# Patient Record
Sex: Female | Born: 1980 | Race: Black or African American | Hispanic: No | Marital: Married | Smoking: Never smoker
Health system: Southern US, Community
[De-identification: ages and names within clinical notes are randomized; demographics above are authoritative.]

## PROBLEM LIST (undated history)

## (undated) DIAGNOSIS — K859 Acute pancreatitis without necrosis or infection, unspecified: Secondary | ICD-10-CM

## (undated) DIAGNOSIS — R109 Unspecified abdominal pain: Secondary | ICD-10-CM

## (undated) DIAGNOSIS — G8929 Other chronic pain: Secondary | ICD-10-CM

## (undated) DIAGNOSIS — I2699 Other pulmonary embolism without acute cor pulmonale: Secondary | ICD-10-CM

## (undated) DIAGNOSIS — K5090 Crohn's disease, unspecified, without complications: Secondary | ICD-10-CM

## (undated) DIAGNOSIS — I10 Essential (primary) hypertension: Secondary | ICD-10-CM

## (undated) DIAGNOSIS — K259 Gastric ulcer, unspecified as acute or chronic, without hemorrhage or perforation: Secondary | ICD-10-CM

## (undated) DIAGNOSIS — K633 Ulcer of intestine: Secondary | ICD-10-CM

## (undated) DIAGNOSIS — I509 Heart failure, unspecified: Secondary | ICD-10-CM

## (undated) DIAGNOSIS — E282 Polycystic ovarian syndrome: Secondary | ICD-10-CM

## (undated) DIAGNOSIS — M797 Fibromyalgia: Secondary | ICD-10-CM

## (undated) HISTORY — PX: CHOLECYSTECTOMY: SHX55

## (undated) HISTORY — PX: TONSILLECTOMY: SUR1361

## (undated) HISTORY — PX: BREAST SURGERY: SHX581

---

## 2002-09-30 ENCOUNTER — Emergency Department (HOSPITAL_COMMUNITY): Admission: EM | Admit: 2002-09-30 | Discharge: 2002-10-01 | Payer: Self-pay | Admitting: Emergency Medicine

## 2002-10-10 ENCOUNTER — Encounter: Admission: RE | Admit: 2002-10-10 | Discharge: 2002-10-10 | Payer: Self-pay | Admitting: *Deleted

## 2004-03-26 ENCOUNTER — Emergency Department (HOSPITAL_COMMUNITY): Admission: EM | Admit: 2004-03-26 | Discharge: 2004-03-26 | Payer: Self-pay | Admitting: Emergency Medicine

## 2005-06-30 ENCOUNTER — Emergency Department (HOSPITAL_COMMUNITY): Admission: EM | Admit: 2005-06-30 | Discharge: 2005-07-01 | Payer: Self-pay | Admitting: Emergency Medicine

## 2006-02-16 ENCOUNTER — Emergency Department: Payer: Self-pay | Admitting: Emergency Medicine

## 2006-03-01 ENCOUNTER — Emergency Department (HOSPITAL_COMMUNITY): Admission: EM | Admit: 2006-03-01 | Discharge: 2006-03-02 | Payer: Self-pay | Admitting: Emergency Medicine

## 2006-04-11 ENCOUNTER — Emergency Department: Payer: Self-pay | Admitting: Emergency Medicine

## 2006-05-06 ENCOUNTER — Emergency Department: Payer: Self-pay | Admitting: Unknown Physician Specialty

## 2006-06-08 ENCOUNTER — Emergency Department: Payer: Self-pay | Admitting: Emergency Medicine

## 2006-06-14 ENCOUNTER — Emergency Department: Payer: Self-pay | Admitting: Emergency Medicine

## 2006-10-14 ENCOUNTER — Emergency Department: Payer: Self-pay | Admitting: Emergency Medicine

## 2006-10-15 ENCOUNTER — Ambulatory Visit: Payer: Self-pay | Admitting: Emergency Medicine

## 2006-10-24 ENCOUNTER — Inpatient Hospital Stay (HOSPITAL_COMMUNITY): Admission: AD | Admit: 2006-10-24 | Discharge: 2006-10-24 | Payer: Self-pay | Admitting: Family Medicine

## 2006-10-30 ENCOUNTER — Inpatient Hospital Stay (HOSPITAL_COMMUNITY): Admission: AD | Admit: 2006-10-30 | Discharge: 2006-10-30 | Payer: Self-pay | Admitting: Gynecology

## 2006-12-09 ENCOUNTER — Emergency Department: Payer: Self-pay | Admitting: Emergency Medicine

## 2007-02-22 ENCOUNTER — Emergency Department: Payer: Self-pay | Admitting: General Practice

## 2007-03-30 ENCOUNTER — Emergency Department: Payer: Self-pay | Admitting: Emergency Medicine

## 2007-05-19 ENCOUNTER — Emergency Department: Payer: Self-pay | Admitting: General Practice

## 2007-05-21 ENCOUNTER — Emergency Department (HOSPITAL_COMMUNITY): Admission: EM | Admit: 2007-05-21 | Discharge: 2007-05-21 | Payer: Self-pay | Admitting: Emergency Medicine

## 2007-05-22 ENCOUNTER — Ambulatory Visit (HOSPITAL_COMMUNITY): Admission: RE | Admit: 2007-05-22 | Discharge: 2007-05-22 | Payer: Self-pay | Admitting: Emergency Medicine

## 2008-05-03 ENCOUNTER — Encounter (INDEPENDENT_AMBULATORY_CARE_PROVIDER_SITE_OTHER): Payer: Self-pay | Admitting: Emergency Medicine

## 2008-05-03 ENCOUNTER — Ambulatory Visit: Payer: Self-pay | Admitting: Vascular Surgery

## 2008-05-03 ENCOUNTER — Emergency Department (HOSPITAL_COMMUNITY): Admission: EM | Admit: 2008-05-03 | Discharge: 2008-05-03 | Payer: Self-pay | Admitting: Emergency Medicine

## 2008-08-31 ENCOUNTER — Emergency Department (HOSPITAL_BASED_OUTPATIENT_CLINIC_OR_DEPARTMENT_OTHER): Admission: EM | Admit: 2008-08-31 | Discharge: 2008-08-31 | Payer: Self-pay | Admitting: Emergency Medicine

## 2008-09-08 ENCOUNTER — Inpatient Hospital Stay: Payer: Self-pay | Admitting: Internal Medicine

## 2008-10-26 ENCOUNTER — Emergency Department (HOSPITAL_BASED_OUTPATIENT_CLINIC_OR_DEPARTMENT_OTHER): Admission: EM | Admit: 2008-10-26 | Discharge: 2008-10-26 | Payer: Self-pay | Admitting: Emergency Medicine

## 2008-10-28 ENCOUNTER — Emergency Department (HOSPITAL_BASED_OUTPATIENT_CLINIC_OR_DEPARTMENT_OTHER): Admission: EM | Admit: 2008-10-28 | Discharge: 2008-10-28 | Payer: Self-pay | Admitting: Emergency Medicine

## 2008-11-10 ENCOUNTER — Emergency Department (HOSPITAL_COMMUNITY): Admission: EM | Admit: 2008-11-10 | Discharge: 2008-11-10 | Payer: Self-pay | Admitting: Emergency Medicine

## 2008-12-03 ENCOUNTER — Emergency Department (HOSPITAL_BASED_OUTPATIENT_CLINIC_OR_DEPARTMENT_OTHER): Admission: EM | Admit: 2008-12-03 | Discharge: 2008-12-03 | Payer: Self-pay | Admitting: Emergency Medicine

## 2008-12-03 ENCOUNTER — Ambulatory Visit: Payer: Self-pay | Admitting: Radiology

## 2008-12-30 ENCOUNTER — Ambulatory Visit: Payer: Self-pay | Admitting: Diagnostic Radiology

## 2008-12-30 ENCOUNTER — Emergency Department (HOSPITAL_BASED_OUTPATIENT_CLINIC_OR_DEPARTMENT_OTHER): Admission: EM | Admit: 2008-12-30 | Discharge: 2008-12-30 | Payer: Self-pay | Admitting: Emergency Medicine

## 2009-04-02 ENCOUNTER — Emergency Department (HOSPITAL_BASED_OUTPATIENT_CLINIC_OR_DEPARTMENT_OTHER): Admission: EM | Admit: 2009-04-02 | Discharge: 2009-04-02 | Payer: Self-pay | Admitting: Emergency Medicine

## 2009-06-15 ENCOUNTER — Emergency Department (HOSPITAL_BASED_OUTPATIENT_CLINIC_OR_DEPARTMENT_OTHER): Admission: EM | Admit: 2009-06-15 | Discharge: 2009-06-16 | Payer: Self-pay | Admitting: Emergency Medicine

## 2009-07-16 ENCOUNTER — Inpatient Hospital Stay (HOSPITAL_COMMUNITY): Admission: AD | Admit: 2009-07-16 | Discharge: 2009-07-16 | Payer: Self-pay | Admitting: Obstetrics & Gynecology

## 2009-09-09 ENCOUNTER — Emergency Department (HOSPITAL_BASED_OUTPATIENT_CLINIC_OR_DEPARTMENT_OTHER): Admission: EM | Admit: 2009-09-09 | Discharge: 2009-09-09 | Payer: Self-pay | Admitting: Emergency Medicine

## 2009-09-09 ENCOUNTER — Ambulatory Visit: Payer: Self-pay | Admitting: Diagnostic Radiology

## 2009-11-01 ENCOUNTER — Emergency Department (HOSPITAL_BASED_OUTPATIENT_CLINIC_OR_DEPARTMENT_OTHER): Admission: EM | Admit: 2009-11-01 | Discharge: 2009-11-01 | Payer: Self-pay | Admitting: Emergency Medicine

## 2009-11-22 ENCOUNTER — Emergency Department (HOSPITAL_COMMUNITY): Admission: EM | Admit: 2009-11-22 | Discharge: 2009-11-22 | Payer: Self-pay | Admitting: Emergency Medicine

## 2010-01-10 ENCOUNTER — Emergency Department (HOSPITAL_BASED_OUTPATIENT_CLINIC_OR_DEPARTMENT_OTHER): Admission: EM | Admit: 2010-01-10 | Discharge: 2010-01-10 | Payer: Self-pay | Admitting: Emergency Medicine

## 2010-01-10 ENCOUNTER — Ambulatory Visit: Payer: Self-pay | Admitting: Interventional Radiology

## 2010-03-11 ENCOUNTER — Emergency Department (HOSPITAL_BASED_OUTPATIENT_CLINIC_OR_DEPARTMENT_OTHER): Admission: EM | Admit: 2010-03-11 | Discharge: 2010-03-11 | Payer: Self-pay | Admitting: Emergency Medicine

## 2010-04-20 ENCOUNTER — Emergency Department: Payer: Self-pay | Admitting: Emergency Medicine

## 2010-05-22 ENCOUNTER — Emergency Department (HOSPITAL_BASED_OUTPATIENT_CLINIC_OR_DEPARTMENT_OTHER): Admission: EM | Admit: 2010-05-22 | Discharge: 2010-05-22 | Payer: Self-pay | Admitting: Emergency Medicine

## 2010-06-27 ENCOUNTER — Emergency Department (HOSPITAL_BASED_OUTPATIENT_CLINIC_OR_DEPARTMENT_OTHER): Admission: EM | Admit: 2010-06-27 | Discharge: 2010-06-27 | Payer: Self-pay | Admitting: Emergency Medicine

## 2010-06-29 ENCOUNTER — Emergency Department (HOSPITAL_BASED_OUTPATIENT_CLINIC_OR_DEPARTMENT_OTHER): Admission: EM | Admit: 2010-06-29 | Discharge: 2010-06-29 | Payer: Self-pay | Admitting: Emergency Medicine

## 2010-07-13 ENCOUNTER — Emergency Department: Payer: Self-pay | Admitting: Emergency Medicine

## 2010-07-31 ENCOUNTER — Emergency Department (HOSPITAL_BASED_OUTPATIENT_CLINIC_OR_DEPARTMENT_OTHER): Admission: EM | Admit: 2010-07-31 | Discharge: 2010-07-31 | Payer: Self-pay | Admitting: Emergency Medicine

## 2010-08-10 ENCOUNTER — Ambulatory Visit: Payer: Self-pay | Admitting: Diagnostic Radiology

## 2010-08-10 ENCOUNTER — Emergency Department (HOSPITAL_BASED_OUTPATIENT_CLINIC_OR_DEPARTMENT_OTHER): Admission: EM | Admit: 2010-08-10 | Discharge: 2010-08-10 | Payer: Self-pay | Admitting: Emergency Medicine

## 2010-09-02 ENCOUNTER — Inpatient Hospital Stay (HOSPITAL_COMMUNITY): Admission: AD | Admit: 2010-09-02 | Discharge: 2010-09-05 | Payer: Self-pay | Admitting: Psychiatry

## 2010-09-02 ENCOUNTER — Ambulatory Visit: Payer: Self-pay | Admitting: Psychiatry

## 2010-09-09 ENCOUNTER — Emergency Department (HOSPITAL_BASED_OUTPATIENT_CLINIC_OR_DEPARTMENT_OTHER): Admission: EM | Admit: 2010-09-09 | Discharge: 2010-09-09 | Payer: Self-pay | Admitting: Emergency Medicine

## 2010-09-09 ENCOUNTER — Ambulatory Visit: Payer: Self-pay | Admitting: Diagnostic Radiology

## 2010-10-03 ENCOUNTER — Emergency Department (HOSPITAL_BASED_OUTPATIENT_CLINIC_OR_DEPARTMENT_OTHER): Admission: EM | Admit: 2010-10-03 | Discharge: 2010-10-03 | Payer: Self-pay | Admitting: Emergency Medicine

## 2010-11-17 ENCOUNTER — Ambulatory Visit: Payer: Self-pay | Admitting: Diagnostic Radiology

## 2010-11-17 ENCOUNTER — Emergency Department (HOSPITAL_BASED_OUTPATIENT_CLINIC_OR_DEPARTMENT_OTHER): Admission: EM | Admit: 2010-11-17 | Discharge: 2010-11-17 | Payer: Self-pay | Admitting: Emergency Medicine

## 2010-12-23 ENCOUNTER — Emergency Department (HOSPITAL_BASED_OUTPATIENT_CLINIC_OR_DEPARTMENT_OTHER)
Admission: EM | Admit: 2010-12-23 | Discharge: 2010-12-23 | Payer: Self-pay | Source: Home / Self Care | Admitting: Emergency Medicine

## 2011-02-16 ENCOUNTER — Emergency Department (HOSPITAL_BASED_OUTPATIENT_CLINIC_OR_DEPARTMENT_OTHER)
Admission: EM | Admit: 2011-02-16 | Discharge: 2011-02-16 | Disposition: A | Discharge status: Home / Self Care | Payer: Medicare Other | Attending: Emergency Medicine | Admitting: Emergency Medicine

## 2011-02-16 DIAGNOSIS — Z86718 Personal history of other venous thrombosis and embolism: Secondary | ICD-10-CM | POA: Insufficient documentation

## 2011-02-16 DIAGNOSIS — I10 Essential (primary) hypertension: Secondary | ICD-10-CM | POA: Insufficient documentation

## 2011-02-16 DIAGNOSIS — E119 Type 2 diabetes mellitus without complications: Secondary | ICD-10-CM | POA: Insufficient documentation

## 2011-02-16 DIAGNOSIS — M549 Dorsalgia, unspecified: Secondary | ICD-10-CM | POA: Insufficient documentation

## 2011-02-16 DIAGNOSIS — G43909 Migraine, unspecified, not intractable, without status migrainosus: Secondary | ICD-10-CM | POA: Insufficient documentation

## 2011-02-16 DIAGNOSIS — Z79899 Other long term (current) drug therapy: Secondary | ICD-10-CM | POA: Insufficient documentation

## 2011-02-28 ENCOUNTER — Emergency Department (INDEPENDENT_AMBULATORY_CARE_PROVIDER_SITE_OTHER): Payer: Medicare Other

## 2011-02-28 ENCOUNTER — Emergency Department (HOSPITAL_BASED_OUTPATIENT_CLINIC_OR_DEPARTMENT_OTHER)
Admission: EM | Admit: 2011-02-28 | Discharge: 2011-02-28 | Disposition: A | Discharge status: Home / Self Care | Payer: Medicare Other | Attending: Emergency Medicine | Admitting: Emergency Medicine

## 2011-02-28 DIAGNOSIS — G43909 Migraine, unspecified, not intractable, without status migrainosus: Secondary | ICD-10-CM | POA: Insufficient documentation

## 2011-02-28 DIAGNOSIS — I10 Essential (primary) hypertension: Secondary | ICD-10-CM | POA: Insufficient documentation

## 2011-02-28 DIAGNOSIS — J029 Acute pharyngitis, unspecified: Secondary | ICD-10-CM | POA: Insufficient documentation

## 2011-02-28 DIAGNOSIS — J069 Acute upper respiratory infection, unspecified: Secondary | ICD-10-CM | POA: Insufficient documentation

## 2011-02-28 DIAGNOSIS — E119 Type 2 diabetes mellitus without complications: Secondary | ICD-10-CM | POA: Insufficient documentation

## 2011-02-28 DIAGNOSIS — Z86718 Personal history of other venous thrombosis and embolism: Secondary | ICD-10-CM | POA: Insufficient documentation

## 2011-02-28 DIAGNOSIS — Z79899 Other long term (current) drug therapy: Secondary | ICD-10-CM | POA: Insufficient documentation

## 2011-02-28 LAB — URINALYSIS, ROUTINE W REFLEX MICROSCOPIC
Bilirubin Urine: NEGATIVE
Glucose, UA: NEGATIVE mg/dL
Hgb urine dipstick: NEGATIVE
Protein, ur: NEGATIVE mg/dL
Urobilinogen, UA: 1 mg/dL (ref 0.0–1.0)

## 2011-02-28 LAB — RAPID STREP SCREEN (MED CTR MEBANE ONLY): Streptococcus, Group A Screen (Direct): NEGATIVE

## 2011-03-02 LAB — POCT CARDIAC MARKERS
CKMB, poc: <1 ng/mL — ABNORMAL LOW (ref 1.0–8.0)
Myoglobin, poc: 29.3 ng/mL (ref 12–200)
Myoglobin, poc: 41 ng/mL (ref 12–200)
Troponin i, poc: <0.05 ng/mL (ref 0.00–0.09)

## 2011-03-02 LAB — COMPREHENSIVE METABOLIC PANEL
ALT: 15 U/L (ref 0–35)
AST: 18 U/L (ref 0–37)
Alkaline Phosphatase: 90 U/L (ref 39–117)
CO2: 28 mEq/L (ref 19–32)
Calcium: 8.9 mg/dL (ref 8.4–10.5)
Chloride: 106 mEq/L (ref 96–112)
GFR calc Af Amer: >60 mL/min (ref >60)
GFR calc non Af Amer: >60 mL/min (ref >60)
Glucose, Bld: 92 mg/dL (ref 70–99)
Sodium: 145 mEq/L (ref 135–145)
Total Bilirubin: 0.4 mg/dL (ref 0.3–1.2)

## 2011-03-02 LAB — CBC
HCT: 33.1 % — ABNORMAL LOW (ref 36.0–46.0)
Hemoglobin: 11 g/dL — ABNORMAL LOW (ref 12.0–15.0)
MCHC: 33.2 g/dL (ref 30.0–36.0)
RBC: 4.43 MIL/uL (ref 3.87–5.11)

## 2011-03-02 LAB — URINE CULTURE
Colony Count: 9000
Culture  Setup Time: 201201040118

## 2011-03-02 LAB — DIFFERENTIAL
Basophils Absolute: 0 10*3/uL (ref 0.0–0.1)
Basophils Relative: 0 % (ref 0–1)
Eosinophils Absolute: 0.2 10*3/uL (ref 0.0–0.7)
Eosinophils Relative: 3 % (ref 0–5)
Neutrophils Relative %: 55 % (ref 43–77)

## 2011-03-02 LAB — URINALYSIS, ROUTINE W REFLEX MICROSCOPIC
Bilirubin Urine: NEGATIVE
Glucose, UA: NEGATIVE mg/dL
Ketones, ur: NEGATIVE mg/dL
Specific Gravity, Urine: 1.009 (ref 1.005–1.030)
pH: 6.5 (ref 5.0–8.0)

## 2011-03-03 LAB — URINALYSIS, ROUTINE W REFLEX MICROSCOPIC
Bilirubin Urine: NEGATIVE
Glucose, UA: NEGATIVE mg/dL
Ketones, ur: NEGATIVE mg/dL
Nitrite: NEGATIVE
Specific Gravity, Urine: 1.005 (ref 1.005–1.030)
pH: 6 (ref 5.0–8.0)

## 2011-03-03 LAB — WET PREP, GENITAL
Trich, Wet Prep: NONE SEEN
WBC, Wet Prep HPF POC: NONE SEEN
Yeast Wet Prep HPF POC: NONE SEEN

## 2011-03-03 LAB — GC/CHLAMYDIA PROBE AMP, GENITAL
Chlamydia, DNA Probe: NEGATIVE
GC Probe Amp, Genital: NEGATIVE

## 2011-03-03 LAB — PREGNANCY, URINE: Preg Test, Ur: NEGATIVE

## 2011-03-05 LAB — URINALYSIS, ROUTINE W REFLEX MICROSCOPIC
Bilirubin Urine: NEGATIVE
Glucose, UA: NEGATIVE mg/dL
Hgb urine dipstick: NEGATIVE
Hgb urine dipstick: NEGATIVE
Ketones, ur: NEGATIVE mg/dL
Nitrite: NEGATIVE
Specific Gravity, Urine: 1.004 — ABNORMAL LOW (ref 1.005–1.030)
Urobilinogen, UA: 0.2 mg/dL (ref 0.0–1.0)
pH: 6 (ref 5.0–8.0)
pH: 6.5 (ref 5.0–8.0)

## 2011-03-05 LAB — GLUCOSE, CAPILLARY
Glucose-Capillary: 100 mg/dL — ABNORMAL HIGH (ref 70–99)
Glucose-Capillary: 87 mg/dL (ref 70–99)
Glucose-Capillary: 97 mg/dL (ref 70–99)

## 2011-03-05 LAB — BASIC METABOLIC PANEL
CO2: 27 mEq/L (ref 19–32)
Chloride: 104 mEq/L (ref 96–112)
GFR calc Af Amer: >60 mL/min (ref >60)
Glucose, Bld: 98 mg/dL (ref 70–99)
Sodium: 142 mEq/L (ref 135–145)

## 2011-03-05 LAB — PREGNANCY, URINE: Preg Test, Ur: NEGATIVE

## 2011-03-06 LAB — CBC
MCH: 26.5 pg (ref 26.0–34.0)
MCV: 80.1 fL (ref 78.0–100.0)
Platelets: 271 10*3/uL (ref 150–400)
Platelets: 251 10*3/uL (ref 150–400)
RBC: 4.4 MIL/uL (ref 3.87–5.11)
RBC: 4.27 MIL/uL (ref 3.87–5.11)
RDW: 16.6 % — ABNORMAL HIGH (ref 11.5–15.5)
WBC: 9.5 10*3/uL (ref 4.0–10.5)
WBC: 8.8 10*3/uL (ref 4.0–10.5)

## 2011-03-06 LAB — URINALYSIS, ROUTINE W REFLEX MICROSCOPIC
Bilirubin Urine: NEGATIVE
Glucose, UA: NEGATIVE mg/dL
Glucose, UA: NEGATIVE mg/dL
Hgb urine dipstick: NEGATIVE
Ketones, ur: NEGATIVE mg/dL
Protein, ur: NEGATIVE mg/dL
Specific Gravity, Urine: 1.012 (ref 1.005–1.030)
pH: 7 (ref 5.0–8.0)
pH: 6 (ref 5.0–8.0)

## 2011-03-06 LAB — BASIC METABOLIC PANEL
CO2: 28 mEq/L (ref 19–32)
CO2: 25 mEq/L (ref 19–32)
Calcium: 9.2 mg/dL (ref 8.4–10.5)
Calcium: 9.2 mg/dL (ref 8.4–10.5)
Chloride: 103 mEq/L (ref 96–112)
Creatinine, Ser: 0.8 mg/dL (ref 0.4–1.2)
Creatinine, Ser: 0.7 mg/dL (ref 0.4–1.2)
GFR calc Af Amer: >60 mL/min (ref >60)
GFR calc Af Amer: >60 mL/min (ref >60)
GFR calc non Af Amer: >60 mL/min (ref >60)
Sodium: 141 mEq/L (ref 135–145)

## 2011-03-06 LAB — DIFFERENTIAL
Basophils Absolute: 0.1 10*3/uL (ref 0.0–0.1)
Basophils Relative: 1 % (ref 0–1)
Eosinophils Absolute: 0.1 10*3/uL (ref 0.0–0.7)
Eosinophils Relative: 2 % (ref 0–5)
Lymphocytes Relative: 31 % (ref 12–46)
Lymphs Abs: 3 10*3/uL (ref 0.7–4.0)
Lymphs Abs: 2.3 10*3/uL (ref 0.7–4.0)
Neutro Abs: 5.8 10*3/uL (ref 1.7–7.7)
Neutrophils Relative %: 57 % (ref 43–77)
Neutrophils Relative %: 66 % (ref 43–77)

## 2011-03-06 LAB — POCT CARDIAC MARKERS
CKMB, poc: <1 ng/mL — ABNORMAL LOW (ref 1.0–8.0)
Myoglobin, poc: 64.1 ng/mL (ref 12–200)

## 2011-03-06 LAB — POCT TOXICOLOGY PANEL

## 2011-03-06 LAB — PROTIME-INR
INR: 1.02 (ref 0.00–1.49)
Prothrombin Time: 13.6 seconds (ref 11.6–15.2)

## 2011-03-06 LAB — PREGNANCY, URINE: Preg Test, Ur: NEGATIVE

## 2011-03-06 LAB — URINE MICROSCOPIC-ADD ON

## 2011-03-06 LAB — APTT: aPTT: 32 seconds (ref 24–37)

## 2011-03-08 LAB — URINALYSIS, ROUTINE W REFLEX MICROSCOPIC
Bilirubin Urine: NEGATIVE
Glucose, UA: NEGATIVE mg/dL
Hgb urine dipstick: NEGATIVE
Specific Gravity, Urine: 1.024 (ref 1.005–1.030)
Urobilinogen, UA: 1 mg/dL (ref 0.0–1.0)

## 2011-03-08 LAB — DIFFERENTIAL
Basophils Absolute: 0.1 10*3/uL (ref 0.0–0.1)
Basophils Relative: 1 % (ref 0–1)
Eosinophils Relative: 1 % (ref 0–5)
Lymphocytes Relative: 28 % (ref 12–46)
Lymphs Abs: 2.5 10*3/uL (ref 0.7–4.0)
Monocytes Absolute: 0.9 10*3/uL (ref 0.1–1.0)
Monocytes Relative: 8 % (ref 3–12)
Neutro Abs: 5.5 10*3/uL (ref 1.7–7.7)
Neutrophils Relative %: 60 % (ref 43–77)

## 2011-03-08 LAB — BASIC METABOLIC PANEL
CO2: 28 mEq/L (ref 19–32)
CO2: 28 mEq/L (ref 19–32)
Calcium: 9.9 mg/dL (ref 8.4–10.5)
GFR calc Af Amer: >60 mL/min (ref >60)
GFR calc non Af Amer: >60 mL/min (ref >60)
Glucose, Bld: 87 mg/dL (ref 70–99)
Potassium: 3.6 mEq/L (ref 3.5–5.1)
Sodium: 141 mEq/L (ref 135–145)
Sodium: 143 mEq/L (ref 135–145)

## 2011-03-08 LAB — URINE MICROSCOPIC-ADD ON

## 2011-03-08 LAB — CBC
HCT: 37.7 % (ref 36.0–46.0)
Hemoglobin: 11.6 g/dL — ABNORMAL LOW (ref 12.0–15.0)
Hemoglobin: 12.3 g/dL (ref 12.0–15.0)
MCHC: 32.5 g/dL (ref 30.0–36.0)
RBC: 4.62 MIL/uL (ref 3.87–5.11)
RDW: 15 % (ref 11.5–15.5)

## 2011-03-08 LAB — LIPASE, BLOOD: Lipase: 124 U/L (ref 23–300)

## 2011-03-09 LAB — URINALYSIS, ROUTINE W REFLEX MICROSCOPIC
Bilirubin Urine: NEGATIVE
Hgb urine dipstick: NEGATIVE
Ketones, ur: NEGATIVE mg/dL
Protein, ur: NEGATIVE mg/dL
Urobilinogen, UA: 0.2 mg/dL (ref 0.0–1.0)

## 2011-03-09 LAB — WOUND CULTURE

## 2011-03-15 LAB — URINALYSIS, ROUTINE W REFLEX MICROSCOPIC
Bilirubin Urine: NEGATIVE
Glucose, UA: NEGATIVE mg/dL
Hgb urine dipstick: NEGATIVE
Ketones, ur: NEGATIVE mg/dL
Protein, ur: NEGATIVE mg/dL
pH: 6 (ref 5.0–8.0)

## 2011-03-15 LAB — COMPREHENSIVE METABOLIC PANEL
ALT: 13 U/L (ref 0–35)
Albumin: 4.3 g/dL (ref 3.5–5.2)
Alkaline Phosphatase: 79 U/L (ref 39–117)
BUN: 8 mg/dL (ref 6–23)
Chloride: 103 mEq/L (ref 96–112)
Glucose, Bld: 108 mg/dL — ABNORMAL HIGH (ref 70–99)
Potassium: 3.7 mEq/L (ref 3.5–5.1)
Sodium: 141 mEq/L (ref 135–145)
Total Bilirubin: 0.3 mg/dL (ref 0.3–1.2)

## 2011-03-15 LAB — CBC
HCT: 36.3 % (ref 36.0–46.0)
Hemoglobin: 11.8 g/dL — ABNORMAL LOW (ref 12.0–15.0)
Platelets: 281 10*3/uL (ref 150–400)
WBC: 9.6 10*3/uL (ref 4.0–10.5)

## 2011-03-15 LAB — DIFFERENTIAL
Basophils Absolute: 0.1 10*3/uL (ref 0.0–0.1)
Basophils Relative: 1 % (ref 0–1)
Eosinophils Absolute: 0.1 10*3/uL (ref 0.0–0.7)
Monocytes Absolute: 0.7 10*3/uL (ref 0.1–1.0)
Monocytes Relative: 7 % (ref 3–12)
Neutro Abs: 5.8 10*3/uL (ref 1.7–7.7)

## 2011-03-24 LAB — WET PREP, GENITAL
Trich, Wet Prep: NONE SEEN
Yeast Wet Prep HPF POC: NONE SEEN

## 2011-03-24 LAB — URINALYSIS, ROUTINE W REFLEX MICROSCOPIC
Bilirubin Urine: NEGATIVE
Glucose, UA: NEGATIVE mg/dL
Hgb urine dipstick: NEGATIVE
Ketones, ur: NEGATIVE mg/dL
Specific Gravity, Urine: 1.018 (ref 1.005–1.030)
pH: 8.5 — ABNORMAL HIGH (ref 5.0–8.0)

## 2011-03-24 LAB — GC/CHLAMYDIA PROBE AMP, GENITAL
Chlamydia, DNA Probe: NEGATIVE
GC Probe Amp, Genital: NEGATIVE

## 2011-03-25 LAB — URINALYSIS, ROUTINE W REFLEX MICROSCOPIC
Bilirubin Urine: NEGATIVE
Glucose, UA: NEGATIVE mg/dL
Hgb urine dipstick: NEGATIVE
Protein, ur: NEGATIVE mg/dL
Urobilinogen, UA: 1 mg/dL (ref 0.0–1.0)

## 2011-03-27 LAB — BASIC METABOLIC PANEL
CO2: 30 mEq/L (ref 19–32)
Calcium: 9.7 mg/dL (ref 8.4–10.5)
Chloride: 102 mEq/L (ref 96–112)
Creatinine, Ser: 0.8 mg/dL (ref 0.4–1.2)
GFR calc Af Amer: >60 mL/min (ref >60)
Glucose, Bld: 79 mg/dL (ref 70–99)

## 2011-03-29 LAB — URINALYSIS, ROUTINE W REFLEX MICROSCOPIC
Glucose, UA: NEGATIVE mg/dL
Hgb urine dipstick: NEGATIVE
Leukocytes, UA: NEGATIVE
pH: 6.5 (ref 5.0–8.0)

## 2011-03-29 LAB — WET PREP, GENITAL
Trich, Wet Prep: NONE SEEN
Yeast Wet Prep HPF POC: NONE SEEN

## 2011-03-29 LAB — URINE MICROSCOPIC-ADD ON

## 2011-03-30 LAB — URINE MICROSCOPIC-ADD ON

## 2011-03-30 LAB — COMPREHENSIVE METABOLIC PANEL
ALT: <6 U/L (ref 0–35)
Albumin: 4.4 g/dL (ref 3.5–5.2)
Alkaline Phosphatase: 85 U/L (ref 39–117)
BUN: 6 mg/dL (ref 6–23)
Chloride: 104 mEq/L (ref 96–112)
Glucose, Bld: 104 mg/dL — ABNORMAL HIGH (ref 70–99)
Potassium: 3.8 mEq/L (ref 3.5–5.1)
Total Bilirubin: 0.4 mg/dL (ref 0.3–1.2)

## 2011-03-30 LAB — DIFFERENTIAL
Basophils Absolute: 0.1 10*3/uL (ref 0.0–0.1)
Basophils Relative: 1 % (ref 0–1)
Eosinophils Absolute: 0.3 10*3/uL (ref 0.0–0.7)
Monocytes Absolute: 1 10*3/uL (ref 0.1–1.0)
Neutro Abs: 5.2 10*3/uL (ref 1.7–7.7)

## 2011-03-30 LAB — URINALYSIS, ROUTINE W REFLEX MICROSCOPIC
Bilirubin Urine: NEGATIVE
Glucose, UA: NEGATIVE mg/dL
Ketones, ur: 15 mg/dL — AB
Specific Gravity, Urine: 1.028 (ref 1.005–1.030)
pH: 6.5 (ref 5.0–8.0)

## 2011-03-30 LAB — CBC
MCV: 81.7 fL (ref 78.0–100.0)
Platelets: 269 10*3/uL (ref 150–400)
WBC: 10.2 10*3/uL (ref 4.0–10.5)

## 2011-03-30 LAB — ABO/RH: ABO/RH(D): O POS

## 2011-03-30 LAB — GC/CHLAMYDIA PROBE AMP, GENITAL: GC Probe Amp, Genital: NEGATIVE

## 2011-04-01 LAB — DIFFERENTIAL
Eosinophils Absolute: 0.2 10*3/uL (ref 0.0–0.7)
Lymphocytes Relative: 26 % (ref 12–46)
Lymphs Abs: 2.5 10*3/uL (ref 0.7–4.0)
Neutrophils Relative %: 66 % (ref 43–77)

## 2011-04-01 LAB — COMPREHENSIVE METABOLIC PANEL
ALT: 11 U/L (ref 0–35)
BUN: 8 mg/dL (ref 6–23)
CO2: 30 mEq/L (ref 19–32)
Calcium: 9.6 mg/dL (ref 8.4–10.5)
Creatinine, Ser: 0.7 mg/dL (ref 0.4–1.2)
GFR calc non Af Amer: >60 mL/min (ref >60)
Glucose, Bld: 91 mg/dL (ref 70–99)

## 2011-04-01 LAB — WET PREP, GENITAL: Clue Cells Wet Prep HPF POC: NONE SEEN

## 2011-04-01 LAB — URINALYSIS, ROUTINE W REFLEX MICROSCOPIC
Bilirubin Urine: NEGATIVE
Ketones, ur: NEGATIVE mg/dL
Nitrite: NEGATIVE
Specific Gravity, Urine: 1.02 (ref 1.005–1.030)
Urobilinogen, UA: 1 mg/dL (ref 0.0–1.0)
pH: 8 (ref 5.0–8.0)

## 2011-04-01 LAB — CBC
HCT: 37.4 % (ref 36.0–46.0)
Hemoglobin: 12.5 g/dL (ref 12.0–15.0)
MCHC: 33.5 g/dL (ref 30.0–36.0)
MCV: 82.3 fL (ref 78.0–100.0)
RBC: 4.54 MIL/uL (ref 3.87–5.11)

## 2011-04-01 LAB — GC/CHLAMYDIA PROBE AMP, GENITAL
Chlamydia, DNA Probe: NEGATIVE
GC Probe Amp, Genital: NEGATIVE

## 2011-04-06 LAB — URINALYSIS, ROUTINE W REFLEX MICROSCOPIC
Bilirubin Urine: NEGATIVE
Ketones, ur: 15 mg/dL — AB
Nitrite: NEGATIVE
Urobilinogen, UA: 1 mg/dL (ref 0.0–1.0)

## 2011-04-06 LAB — PREGNANCY, URINE: Preg Test, Ur: NEGATIVE

## 2011-04-06 LAB — WET PREP, GENITAL: Trich, Wet Prep: NONE SEEN

## 2011-04-06 LAB — CBC
HCT: 37.6 % (ref 36.0–46.0)
MCV: 82.2 fL (ref 78.0–100.0)
Platelets: 241 10*3/uL (ref 150–400)
RDW: 15.4 % (ref 11.5–15.5)

## 2011-04-06 LAB — DIFFERENTIAL
Basophils Absolute: 0 10*3/uL (ref 0.0–0.1)
Basophils Relative: 1 % (ref 0–1)
Eosinophils Absolute: 0.1 10*3/uL (ref 0.0–0.7)
Eosinophils Relative: 1 % (ref 0–5)

## 2011-04-06 LAB — BASIC METABOLIC PANEL
BUN: 6 mg/dL (ref 6–23)
Chloride: 101 mEq/L (ref 96–112)
GFR calc non Af Amer: >60 mL/min (ref >60)
Glucose, Bld: 98 mg/dL (ref 70–99)
Potassium: 4.1 mEq/L (ref 3.5–5.1)

## 2011-04-06 LAB — RPR: RPR Ser Ql: NONREACTIVE

## 2011-04-06 LAB — GC/CHLAMYDIA PROBE AMP, GENITAL: Chlamydia, DNA Probe: NEGATIVE

## 2011-04-23 ENCOUNTER — Emergency Department (HOSPITAL_BASED_OUTPATIENT_CLINIC_OR_DEPARTMENT_OTHER)
Admission: EM | Admit: 2011-04-23 | Discharge: 2011-04-23 | Disposition: A | Discharge status: Home / Self Care | Payer: Medicare Other | Attending: Emergency Medicine | Admitting: Emergency Medicine

## 2011-04-23 DIAGNOSIS — Z79899 Other long term (current) drug therapy: Secondary | ICD-10-CM | POA: Insufficient documentation

## 2011-04-23 DIAGNOSIS — IMO0001 Reserved for inherently not codable concepts without codable children: Secondary | ICD-10-CM | POA: Insufficient documentation

## 2011-04-23 DIAGNOSIS — E119 Type 2 diabetes mellitus without complications: Secondary | ICD-10-CM | POA: Insufficient documentation

## 2011-04-23 DIAGNOSIS — R51 Headache: Secondary | ICD-10-CM | POA: Insufficient documentation

## 2011-04-23 DIAGNOSIS — Z86718 Personal history of other venous thrombosis and embolism: Secondary | ICD-10-CM | POA: Insufficient documentation

## 2011-04-23 DIAGNOSIS — I10 Essential (primary) hypertension: Secondary | ICD-10-CM | POA: Insufficient documentation

## 2011-05-28 ENCOUNTER — Emergency Department (HOSPITAL_BASED_OUTPATIENT_CLINIC_OR_DEPARTMENT_OTHER)
Admission: EM | Admit: 2011-05-28 | Discharge: 2011-05-28 | Disposition: A | Discharge status: Home / Self Care | Payer: Medicare Other | Attending: Emergency Medicine | Admitting: Emergency Medicine

## 2011-05-28 DIAGNOSIS — R51 Headache: Secondary | ICD-10-CM | POA: Insufficient documentation

## 2011-05-28 DIAGNOSIS — E119 Type 2 diabetes mellitus without complications: Secondary | ICD-10-CM | POA: Insufficient documentation

## 2011-05-28 DIAGNOSIS — Z86718 Personal history of other venous thrombosis and embolism: Secondary | ICD-10-CM | POA: Insufficient documentation

## 2011-05-28 DIAGNOSIS — IMO0001 Reserved for inherently not codable concepts without codable children: Secondary | ICD-10-CM | POA: Insufficient documentation

## 2011-05-28 DIAGNOSIS — R109 Unspecified abdominal pain: Secondary | ICD-10-CM | POA: Insufficient documentation

## 2011-05-28 LAB — CBC
HCT: 31.9 % — ABNORMAL LOW (ref 36.0–46.0)
MCH: 25.1 pg — ABNORMAL LOW (ref 26.0–34.0)
MCHC: 33.2 g/dL (ref 30.0–36.0)
RDW: 17.1 % — ABNORMAL HIGH (ref 11.5–15.5)

## 2011-05-28 LAB — URINALYSIS, ROUTINE W REFLEX MICROSCOPIC
Glucose, UA: NEGATIVE mg/dL
Ketones, ur: NEGATIVE mg/dL
Protein, ur: NEGATIVE mg/dL

## 2011-05-28 LAB — COMPREHENSIVE METABOLIC PANEL
Albumin: 4 g/dL (ref 3.5–5.2)
BUN: 10 mg/dL (ref 6–23)
Chloride: 100 mEq/L (ref 96–112)
Creatinine, Ser: 0.7 mg/dL (ref 0.4–1.2)
GFR calc non Af Amer: >60 mL/min (ref >60)
Glucose, Bld: 125 mg/dL — ABNORMAL HIGH (ref 70–99)
Total Bilirubin: 0.1 mg/dL — ABNORMAL LOW (ref 0.3–1.2)

## 2011-05-28 LAB — LIPASE, BLOOD: Lipase: 33 U/L (ref 11–59)

## 2011-05-28 LAB — DIFFERENTIAL
Basophils Absolute: 0 10*3/uL (ref 0.0–0.1)
Basophils Relative: 0 % (ref 0–1)
Eosinophils Relative: 2 % (ref 0–5)
Lymphocytes Relative: 31 % (ref 12–46)
Monocytes Absolute: 0.5 10*3/uL (ref 0.1–1.0)
Monocytes Relative: 6 % (ref 3–12)

## 2011-06-09 ENCOUNTER — Emergency Department (HOSPITAL_BASED_OUTPATIENT_CLINIC_OR_DEPARTMENT_OTHER)
Admission: EM | Admit: 2011-06-09 | Discharge: 2011-06-09 | Disposition: A | Discharge status: Home / Self Care | Payer: Medicare Other | Attending: Emergency Medicine | Admitting: Emergency Medicine

## 2011-06-09 DIAGNOSIS — Z79899 Other long term (current) drug therapy: Secondary | ICD-10-CM | POA: Insufficient documentation

## 2011-06-09 DIAGNOSIS — I10 Essential (primary) hypertension: Secondary | ICD-10-CM | POA: Insufficient documentation

## 2011-06-09 DIAGNOSIS — Z86718 Personal history of other venous thrombosis and embolism: Secondary | ICD-10-CM | POA: Insufficient documentation

## 2011-06-09 DIAGNOSIS — E119 Type 2 diabetes mellitus without complications: Secondary | ICD-10-CM | POA: Insufficient documentation

## 2011-06-09 DIAGNOSIS — G8929 Other chronic pain: Secondary | ICD-10-CM | POA: Insufficient documentation

## 2011-06-09 DIAGNOSIS — IMO0001 Reserved for inherently not codable concepts without codable children: Secondary | ICD-10-CM | POA: Insufficient documentation

## 2011-06-14 ENCOUNTER — Emergency Department (HOSPITAL_BASED_OUTPATIENT_CLINIC_OR_DEPARTMENT_OTHER)
Admission: EM | Admit: 2011-06-14 | Discharge: 2011-06-14 | Disposition: A | Discharge status: Home / Self Care | Payer: Medicare Other | Attending: Emergency Medicine | Admitting: Emergency Medicine

## 2011-06-14 ENCOUNTER — Emergency Department (INDEPENDENT_AMBULATORY_CARE_PROVIDER_SITE_OTHER): Payer: Medicare Other

## 2011-06-14 DIAGNOSIS — I10 Essential (primary) hypertension: Secondary | ICD-10-CM | POA: Insufficient documentation

## 2011-06-14 DIAGNOSIS — R109 Unspecified abdominal pain: Secondary | ICD-10-CM | POA: Insufficient documentation

## 2011-06-14 DIAGNOSIS — E119 Type 2 diabetes mellitus without complications: Secondary | ICD-10-CM | POA: Insufficient documentation

## 2011-06-14 DIAGNOSIS — G8929 Other chronic pain: Secondary | ICD-10-CM | POA: Insufficient documentation

## 2011-06-14 DIAGNOSIS — Z86718 Personal history of other venous thrombosis and embolism: Secondary | ICD-10-CM | POA: Insufficient documentation

## 2011-06-14 DIAGNOSIS — IMO0001 Reserved for inherently not codable concepts without codable children: Secondary | ICD-10-CM | POA: Insufficient documentation

## 2011-06-14 DIAGNOSIS — R112 Nausea with vomiting, unspecified: Secondary | ICD-10-CM | POA: Insufficient documentation

## 2011-06-14 LAB — COMPREHENSIVE METABOLIC PANEL
ALT: 15 U/L (ref 0–35)
Alkaline Phosphatase: 73 U/L (ref 39–117)
BUN: 11 mg/dL (ref 6–23)
CO2: 26 mEq/L (ref 19–32)
Calcium: 9.8 mg/dL (ref 8.4–10.5)
GFR calc Af Amer: >60 mL/min (ref >60)
GFR calc non Af Amer: >60 mL/min (ref >60)
Glucose, Bld: 133 mg/dL — ABNORMAL HIGH (ref 70–99)
Total Protein: 7.9 g/dL (ref 6.0–8.3)

## 2011-06-14 LAB — CBC
HCT: 33.3 % — ABNORMAL LOW (ref 36.0–46.0)
Hemoglobin: 11.3 g/dL — ABNORMAL LOW (ref 12.0–15.0)
MCHC: 33.9 g/dL (ref 30.0–36.0)
MCV: 75 fL — ABNORMAL LOW (ref 78.0–100.0)
RDW: 17.2 % — ABNORMAL HIGH (ref 11.5–15.5)

## 2011-06-14 LAB — URINALYSIS, ROUTINE W REFLEX MICROSCOPIC
Glucose, UA: NEGATIVE mg/dL
Nitrite: NEGATIVE
Protein, ur: NEGATIVE mg/dL
Urobilinogen, UA: 0.2 mg/dL (ref 0.0–1.0)

## 2011-06-14 LAB — DIFFERENTIAL
Eosinophils Relative: 3 % (ref 0–5)
Lymphocytes Relative: 36 % (ref 12–46)
Lymphs Abs: 3.8 10*3/uL (ref 0.7–4.0)
Monocytes Absolute: 0.8 10*3/uL (ref 0.1–1.0)
Monocytes Relative: 8 % (ref 3–12)
Neutro Abs: 5.7 10*3/uL (ref 1.7–7.7)

## 2011-06-14 LAB — LIPASE, BLOOD: Lipase: 28 U/L (ref 11–59)

## 2011-06-14 LAB — URINE MICROSCOPIC-ADD ON

## 2011-06-19 ENCOUNTER — Emergency Department (HOSPITAL_BASED_OUTPATIENT_CLINIC_OR_DEPARTMENT_OTHER)
Admission: EM | Admit: 2011-06-19 | Discharge: 2011-06-20 | Disposition: A | Discharge status: Home / Self Care | Payer: Medicare Other | Attending: Emergency Medicine | Admitting: Emergency Medicine

## 2011-06-19 DIAGNOSIS — Z86718 Personal history of other venous thrombosis and embolism: Secondary | ICD-10-CM | POA: Insufficient documentation

## 2011-06-19 DIAGNOSIS — R1013 Epigastric pain: Secondary | ICD-10-CM | POA: Insufficient documentation

## 2011-06-19 DIAGNOSIS — E119 Type 2 diabetes mellitus without complications: Secondary | ICD-10-CM | POA: Insufficient documentation

## 2011-06-19 DIAGNOSIS — I10 Essential (primary) hypertension: Secondary | ICD-10-CM | POA: Insufficient documentation

## 2011-06-19 LAB — CBC
HCT: 30.5 % — ABNORMAL LOW (ref 36.0–46.0)
MCHC: 33.1 g/dL (ref 30.0–36.0)
Platelets: 271 10*3/uL (ref 150–400)
RDW: 17.1 % — ABNORMAL HIGH (ref 11.5–15.5)
WBC: 10.3 10*3/uL (ref 4.0–10.5)

## 2011-06-19 LAB — DIFFERENTIAL
Basophils Absolute: 0 10*3/uL (ref 0.0–0.1)
Basophils Relative: 0 % (ref 0–1)
Eosinophils Relative: 3 % (ref 0–5)
Lymphocytes Relative: 40 % (ref 12–46)
Monocytes Absolute: 0.8 10*3/uL (ref 0.1–1.0)
Neutro Abs: 5.1 10*3/uL (ref 1.7–7.7)

## 2011-06-20 LAB — COMPREHENSIVE METABOLIC PANEL
ALT: 12 U/L (ref 0–35)
AST: 21 U/L (ref 0–37)
Albumin: 3.9 g/dL (ref 3.5–5.2)
Alkaline Phosphatase: 71 U/L (ref 39–117)
BUN: 9 mg/dL (ref 6–23)
Chloride: 99 mEq/L (ref 96–112)
Potassium: 3.9 mEq/L (ref 3.5–5.1)
Sodium: 138 mEq/L (ref 135–145)
Total Bilirubin: 0.2 mg/dL — ABNORMAL LOW (ref 0.3–1.2)
Total Protein: 7.7 g/dL (ref 6.0–8.3)

## 2011-06-20 LAB — LIPASE, BLOOD: Lipase: 33 U/L (ref 11–59)

## 2011-07-21 ENCOUNTER — Encounter: Payer: Self-pay | Admitting: *Deleted

## 2011-07-21 ENCOUNTER — Emergency Department (INDEPENDENT_AMBULATORY_CARE_PROVIDER_SITE_OTHER): Payer: Medicare Other

## 2011-07-21 ENCOUNTER — Emergency Department (HOSPITAL_BASED_OUTPATIENT_CLINIC_OR_DEPARTMENT_OTHER)
Admission: EM | Admit: 2011-07-21 | Discharge: 2011-07-22 | Disposition: A | Discharge status: Home / Self Care | Payer: Medicare Other | Attending: Emergency Medicine | Admitting: Emergency Medicine

## 2011-07-21 DIAGNOSIS — R109 Unspecified abdominal pain: Secondary | ICD-10-CM

## 2011-07-21 DIAGNOSIS — N949 Unspecified condition associated with female genital organs and menstrual cycle: Secondary | ICD-10-CM

## 2011-07-21 DIAGNOSIS — IMO0001 Reserved for inherently not codable concepts without codable children: Secondary | ICD-10-CM | POA: Insufficient documentation

## 2011-07-21 DIAGNOSIS — E119 Type 2 diabetes mellitus without complications: Secondary | ICD-10-CM | POA: Insufficient documentation

## 2011-07-21 DIAGNOSIS — I10 Essential (primary) hypertension: Secondary | ICD-10-CM | POA: Insufficient documentation

## 2011-07-21 DIAGNOSIS — R1084 Generalized abdominal pain: Secondary | ICD-10-CM

## 2011-07-21 DIAGNOSIS — E282 Polycystic ovarian syndrome: Secondary | ICD-10-CM

## 2011-07-21 DIAGNOSIS — K5090 Crohn's disease, unspecified, without complications: Secondary | ICD-10-CM

## 2011-07-21 DIAGNOSIS — N9489 Other specified conditions associated with female genital organs and menstrual cycle: Secondary | ICD-10-CM | POA: Insufficient documentation

## 2011-07-21 HISTORY — DX: Acute pancreatitis without necrosis or infection, unspecified: K85.90

## 2011-07-21 HISTORY — DX: Crohn's disease, unspecified, without complications: K50.90

## 2011-07-21 HISTORY — DX: Essential (primary) hypertension: I10

## 2011-07-21 HISTORY — DX: Unspecified abdominal pain: R10.9

## 2011-07-21 HISTORY — DX: Fibromyalgia: M79.7

## 2011-07-21 LAB — URINALYSIS, ROUTINE W REFLEX MICROSCOPIC
Bilirubin Urine: NEGATIVE
Glucose, UA: NEGATIVE mg/dL
Hgb urine dipstick: NEGATIVE
Ketones, ur: NEGATIVE mg/dL
Nitrite: NEGATIVE
Specific Gravity, Urine: 1.003 — ABNORMAL LOW (ref 1.005–1.030)
pH: 6.5 (ref 5.0–8.0)

## 2011-07-21 MED ORDER — PROMETHAZINE HCL 25 MG/ML IJ SOLN
12.5000 mg | Freq: Once | INTRAMUSCULAR | Status: AC
  Administered 2011-07-21: 12.5 mg via INTRAMUSCULAR

## 2011-07-21 MED ORDER — KETOROLAC TROMETHAMINE 60 MG/2ML IM SOLN
60.0000 mg | Freq: Once | INTRAMUSCULAR | Status: AC
  Administered 2011-07-21: 60 mg via INTRAMUSCULAR

## 2011-07-21 MED ORDER — HYDROCODONE-ACETAMINOPHEN 5-325 MG PO TABS: 2.0000 | ORAL_TABLET | ORAL | Status: AC | PRN

## 2011-07-21 MED ORDER — HYDROMORPHONE HCL 2 MG/ML IJ SOLN
2.0000 mg | Freq: Once | INTRAMUSCULAR | Status: AC
  Administered 2011-07-21: 2 mg via INTRAMUSCULAR

## 2011-07-21 NOTE — ED Provider Notes (Signed)
History     Chief Complaint  Patient presents with  . Abdominal Pain   Patient is a 30 y.o. female presenting with abdominal pain. The history is provided by the patient.  Abdominal Pain The primary symptoms of the illness include abdominal pain. The primary symptoms of the illness do not include vaginal discharge or vaginal bleeding. The current episode started more than 2 days ago. The onset of the illness was gradual. The problem has been gradually worsening.  The patient states that she believes she is currently not pregnant. The patient has not had a change in bowel habit. Significant associated medical issues include inflammatory bowel disease.  Pt has a history of ovarian cyst.  Pt reports she feels like she has an ovarian cyst.  Pt has had multiple evaluations for chronic abdominal pain.  Past Medical History  Diagnosis Date  . Crohn disease   . Abdominal pain, unspecified site   . Hypertension   . Diabetes mellitus   . Pancreatitis   . Fibromyalgia     Past Surgical History  Procedure Date  . Breast surgery   . Tonsillectomy     History reviewed. No pertinent family history.  History  Substance Use Topics  . Smoking status: Never Smoker   . Smokeless tobacco: Not on file  . Alcohol Use: No    OB History    Grav Para Term Preterm Abortions TAB SAB Ect Mult Living                  Review of Systems  Gastrointestinal: Positive for abdominal pain.  Genitourinary: Positive for pelvic pain. Negative for vaginal bleeding, vaginal discharge and vaginal pain.  All other systems reviewed and are negative.    Physical Exam  BP 127/84  Pulse 88  Temp(Src) 98.1 F (36.7 C) (Oral)  Resp 16  Wt 205 lb (92.987 kg)  SpO2 100%  Physical Exam  Constitutional: She appears well-developed and well-nourished.  HENT:  Head: Normocephalic.  Neck: Normal range of motion. Neck supple.  Cardiovascular: Normal rate.   Pulmonary/Chest: Effort normal.  Abdominal: Soft.  She exhibits no distension and no mass. There is no rebound and no guarding.  Musculoskeletal: Normal range of motion.  Neurological: She is alert.  Skin: Skin is warm.    ED Course  Procedures  MDM Pt given shot of Dilaudid and Phenergan.       Langston Masker, [place] 07/21/11 2146  Langston Masker, [place] 07/21/11 2249

## 2011-07-21 NOTE — ED Notes (Signed)
Pt c/o abd pain and n/v x 1 week. Seen here multiple times for same. Recent ENdo done. Unknown results

## 2011-07-21 NOTE — ED Provider Notes (Signed)
Medical screening examination/treatment/procedure(s) were performed by non-physician practitioner and as supervising physician I was immediately available for consultation/collaboration.   Hanley Seamen, MD 07/21/11 [phone]

## 2011-09-03 ENCOUNTER — Emergency Department (HOSPITAL_BASED_OUTPATIENT_CLINIC_OR_DEPARTMENT_OTHER)
Admission: EM | Admit: 2011-09-03 | Discharge: 2011-09-04 | Disposition: A | Discharge status: Home / Self Care | Payer: Medicare Other | Attending: Emergency Medicine | Admitting: Emergency Medicine

## 2011-09-03 ENCOUNTER — Encounter (HOSPITAL_BASED_OUTPATIENT_CLINIC_OR_DEPARTMENT_OTHER): Payer: Self-pay | Admitting: *Deleted

## 2011-09-03 ENCOUNTER — Emergency Department (INDEPENDENT_AMBULATORY_CARE_PROVIDER_SITE_OTHER): Payer: Medicare Other

## 2011-09-03 DIAGNOSIS — M549 Dorsalgia, unspecified: Secondary | ICD-10-CM

## 2011-09-03 DIAGNOSIS — R1084 Generalized abdominal pain: Secondary | ICD-10-CM | POA: Insufficient documentation

## 2011-09-03 DIAGNOSIS — I10 Essential (primary) hypertension: Secondary | ICD-10-CM | POA: Insufficient documentation

## 2011-09-03 DIAGNOSIS — R079 Chest pain, unspecified: Secondary | ICD-10-CM

## 2011-09-03 DIAGNOSIS — IMO0001 Reserved for inherently not codable concepts without codable children: Secondary | ICD-10-CM | POA: Insufficient documentation

## 2011-09-03 DIAGNOSIS — R112 Nausea with vomiting, unspecified: Secondary | ICD-10-CM | POA: Insufficient documentation

## 2011-09-03 DIAGNOSIS — R110 Nausea: Secondary | ICD-10-CM

## 2011-09-03 DIAGNOSIS — Z79899 Other long term (current) drug therapy: Secondary | ICD-10-CM | POA: Insufficient documentation

## 2011-09-03 DIAGNOSIS — K5090 Crohn's disease, unspecified, without complications: Secondary | ICD-10-CM | POA: Insufficient documentation

## 2011-09-03 LAB — COMPREHENSIVE METABOLIC PANEL
ALT: 21 U/L (ref 0–35)
AST: 23 U/L (ref 0–37)
Albumin: 4.2 g/dL (ref 3.5–5.2)
CO2: 29 mEq/L (ref 19–32)
Calcium: 10.5 mg/dL (ref 8.4–10.5)
Creatinine, Ser: 0.6 mg/dL (ref 0.50–1.10)
Sodium: 138 mEq/L (ref 135–145)
Total Protein: 8.6 g/dL — ABNORMAL HIGH (ref 6.0–8.3)

## 2011-09-03 LAB — URINALYSIS, ROUTINE W REFLEX MICROSCOPIC
Hgb urine dipstick: NEGATIVE
Ketones, ur: NEGATIVE mg/dL
Protein, ur: NEGATIVE mg/dL
Urobilinogen, UA: 0.2 mg/dL (ref 0.0–1.0)

## 2011-09-03 LAB — DIFFERENTIAL
Basophils Absolute: 0.1 10*3/uL (ref 0.0–0.1)
Basophils Relative: 0 % (ref 0–1)
Eosinophils Absolute: 0.3 10*3/uL (ref 0.0–0.7)
Eosinophils Relative: 2 % (ref 0–5)
Lymphocytes Relative: 38 % (ref 12–46)
Monocytes Absolute: 1 10*3/uL (ref 0.1–1.0)

## 2011-09-03 LAB — CBC
MCHC: 33.6 g/dL (ref 30.0–36.0)
MCV: 77 fL — ABNORMAL LOW (ref 78.0–100.0)
Platelets: 290 10*3/uL (ref 150–400)
RDW: 16.5 % — ABNORMAL HIGH (ref 11.5–15.5)
WBC: 11.3 10*3/uL — ABNORMAL HIGH (ref 4.0–10.5)

## 2011-09-03 MED ORDER — KETOROLAC TROMETHAMINE 30 MG/ML IJ SOLN
30.0000 mg | Freq: Once | INTRAMUSCULAR | Status: AC
  Administered 2011-09-03: 30 mg via INTRAVENOUS
  Filled 2011-09-03: qty 1

## 2011-09-03 MED ORDER — PROMETHAZINE HCL 25 MG/ML IJ SOLN
12.5000 mg | Freq: Once | INTRAMUSCULAR | Status: AC
  Administered 2011-09-03: 12.5 mg via INTRAVENOUS
  Filled 2011-09-03: qty 1

## 2011-09-03 MED ORDER — PROMETHAZINE HCL 25 MG PO TABS: 25.0000 mg | ORAL_TABLET | Freq: Four times a day (QID) | ORAL | Status: DC | PRN

## 2011-09-03 MED ORDER — LORAZEPAM 2 MG/ML IJ SOLN
1.0000 mg | Freq: Once | INTRAMUSCULAR | Status: AC
  Administered 2011-09-03: 1 mg via INTRAVENOUS
  Filled 2011-09-03: qty 1

## 2011-09-03 MED ORDER — FENTANYL CITRATE 0.05 MG/ML IJ SOLN
50.0000 ug | Freq: Once | INTRAMUSCULAR | Status: AC
Start: 2011-09-03 — End: 2011-09-03
  Administered 2011-09-03: 50 ug via INTRAVENOUS
  Filled 2011-09-03: qty 2

## 2011-09-03 MED ORDER — ONDANSETRON HCL 4 MG/2ML IJ SOLN: 4.0000 mg | Freq: Once | INTRAMUSCULAR | Status: DC

## 2011-09-03 MED ORDER — SODIUM CHLORIDE 0.9 % IV BOLUS (SEPSIS)
1000.0000 mL | Freq: Once | INTRAVENOUS | Status: AC
  Administered 2011-09-03: 1000 mL via INTRAVENOUS

## 2011-09-03 MED ORDER — DIPHENHYDRAMINE HCL 50 MG/ML IJ SOLN
25.0000 mg | Freq: Once | INTRAMUSCULAR | Status: AC
  Administered 2011-09-03: 25 mg via INTRAVENOUS
  Filled 2011-09-03: qty 1

## 2011-09-03 MED ORDER — HYDROCODONE-ACETAMINOPHEN 5-325 MG PO TABS: 1.0000 | ORAL_TABLET | ORAL | Status: AC | PRN

## 2011-09-03 NOTE — ED Notes (Signed)
Pt reports abd pain x 1.5 weeks; back pain x 1 week- n/v x 5 days- states "i'm not able to eat or drink"- also requesting to have b/p check

## 2011-09-03 NOTE — ED Provider Notes (Signed)
History     CSN: [id number] Arrival date & time: 09/03/2011  8:11 PM   Chief Complaint  Patient presents with  . Back Pain  . Abdominal Pain     (Include location/radiation/quality/duration/timing/severity/associated sxs/prior treatment) HPI Comments: Patient complains of multiple symptoms over the last week. She complains of diffuse abdominal pain which does not localize to any particular area. She has had some associated nausea and vomiting that is nonbloody non-coffee ground emesis. She states that she has had normal bowel movements. She also complains of some upper back pain that can change with positions. She's had no falls or injuries. No specific cough or shortness of breath. She also has some vague headaches. Denies any specific urinary symptoms. Her last menstrual period was at the end of August and normal for her. She denies any vaginal discharge.  Patient is a 30 y.o. female presenting with back pain and abdominal pain. The history is provided by the patient. No language interpreter was used.  Back Pain  This is a new problem. The current episode started more than 1 week ago. The problem occurs daily. The problem has not changed since onset.The pain is associated with no known injury. The pain is present in the thoracic spine. The quality of the pain is described as aching. The pain does not radiate. The pain is moderate. The symptoms are aggravated by certain positions. Associated symptoms include headaches and abdominal pain. Pertinent negatives include no fever, no numbness, no weight loss, no abdominal swelling, no bowel incontinence, no bladder incontinence, no dysuria, no pelvic pain, no leg pain, no paresthesias, no paresis, no tingling and no weakness. She has tried nothing for the symptoms. The treatment provided no relief.  Abdominal Pain The primary symptoms of the illness include abdominal pain, nausea and vomiting. The primary symptoms of the illness do not include fever,  diarrhea or dysuria.  Additional symptoms associated with the illness include back pain.     Past Medical History  Diagnosis Date  . Crohn disease   . Abdominal pain, unspecified site   . Hypertension   . Diabetes mellitus   . Pancreatitis   . Fibromyalgia   . Migraine      Past Surgical History  Procedure Date  . Breast surgery   . Tonsillectomy     History reviewed. No pertinent family history.  History  Substance Use Topics  . Smoking status: Never Smoker   . Smokeless tobacco: Not on file  . Alcohol Use: No    OB History    Grav Para Term Preterm Abortions TAB SAB Ect Mult Living                  Review of Systems  Constitutional: Negative for fever and weight loss.  Gastrointestinal: Positive for nausea, vomiting and abdominal pain. Negative for diarrhea, rectal pain and bowel incontinence.  Genitourinary: Negative for bladder incontinence, dysuria and pelvic pain.  Musculoskeletal: Positive for back pain.  Neurological: Positive for headaches. Negative for tingling, weakness, numbness and paresthesias.    Allergies  Caffeine; Compazine; Decadron; Flagyl; Reglan; and Zofran  Home Medications   Current Outpatient Rx  Name Route Sig Dispense Refill  . DIAZEPAM 5 MG PO TABS Oral Take 5 mg by mouth 2 (two) times daily as needed. For anxiety or back pain    . FLUTICASONE PROPIONATE 50 MCG/ACT NA SUSP Nasal Place 2 sprays into the nose daily.     Marland Kitchen LORATADINE 10 MG PO TABS Oral Take 10  mg by mouth daily.      . LUBIPROSTONE 8 MCG PO CAPS Oral Take 8 mcg by mouth 2 (two) times daily.      Marland Kitchen METFORMIN HCL 850 MG PO TABS Oral Take 850 mg by mouth 2 (two) times daily with a meal.      . NEBIVOLOL HCL 5 MG PO TABS Oral Take 5 mg by mouth daily.      Marland Kitchen OLMESARTAN-AMLODIPINE-HCTZ 40-5-25 MG PO TABS Oral Take 1 tablet by mouth daily.      . TRAMADOL HCL 50 MG PO TABS Oral Take 50 mg by mouth 3 (three) times daily as needed. For pain     . ZOLPIDEM TARTRATE 10 MG PO  TABS Oral Take 10 mg by mouth at bedtime.      . NEBIVOLOL HCL 10 MG PO TABS Oral Take 10 mg by mouth daily.        Physical Exam    BP 121/76  Pulse 89  Temp(Src) 99.5 F (37.5 C) (Oral)  Resp 20  Ht 5\' 6"  (1.676 m)  Wt 213 lb (96.616 kg)  BMI 34.38 kg/m2  SpO2 98%  LMP 08/15/2011  Physical Exam  Constitutional: She is oriented to person, place, and time. She appears well-developed and well-nourished.  Non-toxic appearance. She does not have a sickly appearance.  HENT:  Head: Normocephalic and atraumatic.  Eyes: Conjunctivae, EOM and lids are normal. Pupils are equal, round, and reactive to light. No scleral icterus.  Neck: Trachea normal and normal range of motion. Neck supple.  Cardiovascular: Normal rate, regular rhythm and normal heart sounds.   Pulmonary/Chest: Effort normal and breath sounds normal. No respiratory distress. She has no wheezes. She has no rales. She exhibits no tenderness.  Abdominal: Soft. Normal appearance. There is no tenderness. There is no rebound, no guarding and no CVA tenderness.  Musculoskeletal: Normal range of motion.  Neurological: She is alert and oriented to person, place, and time. She has normal strength.  Skin: Skin is warm, dry and intact. No rash noted.  Psychiatric: She has a normal mood and affect. Her behavior is normal. Judgment and thought content normal.    ED Course  Procedures  Results for orders placed during the hospital encounter of 09/03/11  PREGNANCY, URINE      Component Value Range   Preg Test, Ur NEGATIVE    URINALYSIS, ROUTINE W REFLEX MICROSCOPIC      Component Value Range   Color, Urine YELLOW  YELLOW    Appearance CLEAR  CLEAR    Specific Gravity, Urine 1.005  1.005 - 1.030    pH 6.5  5.0 - 8.0    Glucose, UA NEGATIVE  NEGATIVE (mg/dL)   Hgb urine dipstick NEGATIVE  NEGATIVE    Bilirubin Urine NEGATIVE  NEGATIVE    Ketones, ur NEGATIVE  NEGATIVE (mg/dL)   Protein, ur NEGATIVE  NEGATIVE (mg/dL)    Urobilinogen, UA 0.2  0.0 - 1.0 (mg/dL)   Nitrite NEGATIVE  NEGATIVE    Leukocytes, UA NEGATIVE  NEGATIVE   CBC      Component Value Range   WBC 11.3 (*) 4.0 - 10.5 (K/uL)   RBC 4.40  3.87 - 5.11 (MIL/uL)   Hemoglobin 11.4 (*) 12.0 - 15.0 (g/dL)   HCT 91.4 (*) 78.2 - 46.0 (%)   MCV 77.0 (*) 78.0 - 100.0 (fL)   MCH 25.9 (*) 26.0 - 34.0 (pg)   MCHC 33.6  30.0 - 36.0 (g/dL)   RDW 95.6 (*)  11.5 - 15.5 (%)   Platelets 290  150 - 400 (K/uL)  DIFFERENTIAL      Component Value Range   Neutrophils Relative 51  43 - 77 (%)   Neutro Abs 5.8  1.7 - 7.7 (K/uL)   Lymphocytes Relative 38  12 - 46 (%)   Lymphs Abs 4.3 (*) 0.7 - 4.0 (K/uL)   Monocytes Relative 8  3 - 12 (%)   Monocytes Absolute 1.0  0.1 - 1.0 (K/uL)   Eosinophils Relative 2  0 - 5 (%)   Eosinophils Absolute 0.3  0.0 - 0.7 (K/uL)   Basophils Relative 0  0 - 1 (%)   Basophils Absolute 0.1  0.0 - 0.1 (K/uL)  COMPREHENSIVE METABOLIC PANEL      Component Value Range   Sodium 138  135 - 145 (mEq/L)   Potassium 3.9  3.5 - 5.1 (mEq/L)   Chloride 99  96 - 112 (mEq/L)   CO2 29  19 - 32 (mEq/L)   Glucose, Bld 94  70 - 99 (mg/dL)   BUN 6  6 - 23 (mg/dL)   Creatinine, Ser 4.54  0.50 - 1.10 (mg/dL)   Calcium 09.8  8.4 - 10.5 (mg/dL)   Total Protein 8.6 (*) 6.0 - 8.3 (g/dL)   Albumin 4.2  3.5 - 5.2 (g/dL)   AST 23  0 - 37 (U/L)   ALT 21  0 - 35 (U/L)   Alkaline Phosphatase 89  39 - 117 (U/L)   Total Bilirubin 0.2 (*) 0.3 - 1.2 (mg/dL)   GFR calc non Af Amer >60  >60 (mL/min)   GFR calc Af Amer >60  >60 (mL/min)  LIPASE, BLOOD      Component Value Range   Lipase 32  11 - 59 (U/L)   Dg Chest 2 View  09/03/2011  *RADIOLOGY REPORT*  Clinical Data: Chest and back pain; nausea for 1 week.  CHEST - 2 VIEW  Comparison: Chest radiograph performed 02/28/2011  Findings: The lungs are well-aerated.  Minimal bibasilar atelectasis is noted.  The There is no evidence of pleural effusion or pneumothorax.  The heart is normal in size; the  mediastinal contour is within normal limits.  No acute osseous abnormalities are seen.  IMPRESSION: Minimal bibasilar atelectasis noted; lungs otherwise clear.  Original Report Authenticated By: Tonia Ghent, M.D.     No diagnosis found.   MDM Patient with also with complaints of back pain, abdominal pain, vomiting, headaches for the last week. At this point time I found no specific infections as a cause for her symptoms. She has no signs of pneumonia on her x-ray. She has no UTI. She denies any vaginal discharge. She has no focal abdominal pain to indicate signs of appendicitis, cholecystitis, pelvic infection. She has no elevated LFTs or lipase.  She has only a mild elevation in her white blood cell count at this time. She has no specific abnormalities and her vital signs. She has a soft benign abdomen on exam. Patient may have a general viral syndrome such as mononucleosis although it is unclear. As she has normal vital signs and essentially normal labs at this point in time I believe she is safe for discharge home with followup with her primary care physician. She is going to call tomorrow to get an appointment either tomorrow or next week. She understands to come back for worsening pain or other symptoms.       Nat Christen, MD 09/03/11 [phone]

## 2011-09-03 NOTE — ED Notes (Signed)
States she needs more pain medication.

## 2011-09-16 LAB — CBC
Hemoglobin: 11.8 — ABNORMAL LOW
MCHC: 32.6
MCV: 78.3
RBC: 4.61
WBC: 11 — ABNORMAL HIGH

## 2011-09-16 LAB — COMPREHENSIVE METABOLIC PANEL
ALT: 9
AST: 16
CO2: 26
Calcium: 9.6
Chloride: 103
Creatinine, Ser: 0.7
GFR calc Af Amer: >60
GFR calc non Af Amer: >60
Glucose, Bld: 94
Total Bilirubin: 0.4

## 2011-09-16 LAB — LIPASE, BLOOD: Lipase: 22

## 2011-09-16 LAB — DIFFERENTIAL
Basophils Absolute: 0.1
Eosinophils Absolute: 0.1
Eosinophils Relative: 1
Lymphocytes Relative: 21
Lymphs Abs: 2.4
Neutrophils Relative %: 72

## 2011-09-16 LAB — URINALYSIS, ROUTINE W REFLEX MICROSCOPIC
Bilirubin Urine: NEGATIVE
Hgb urine dipstick: NEGATIVE
Nitrite: NEGATIVE
Protein, ur: NEGATIVE
Urobilinogen, UA: 1

## 2011-09-16 LAB — D-DIMER, QUANTITATIVE: D-Dimer, Quant: 0.65 — ABNORMAL HIGH

## 2011-09-22 LAB — URINALYSIS, ROUTINE W REFLEX MICROSCOPIC
Bilirubin Urine: NEGATIVE
Glucose, UA: NEGATIVE
Glucose, UA: NEGATIVE
Ketones, ur: NEGATIVE
Ketones, ur: NEGATIVE
Ketones, ur: NEGATIVE
Nitrite: NEGATIVE
Nitrite: NEGATIVE
Protein, ur: NEGATIVE
Protein, ur: NEGATIVE
Specific Gravity, Urine: 1.023
Urobilinogen, UA: 1
pH: 6
pH: 7.5

## 2011-09-22 LAB — CBC
HCT: 38.2
Hemoglobin: 11.8 — ABNORMAL LOW
Hemoglobin: 12.4
MCHC: 32.1
MCHC: 32.4
MCV: 80.8
Platelets: 265
RBC: 4.43
RBC: 4.72
RDW: 17.5 — ABNORMAL HIGH
RDW: 16.8 — ABNORMAL HIGH
RDW: 17.2 — ABNORMAL HIGH
WBC: 9.6

## 2011-09-22 LAB — LIPASE, BLOOD
Lipase: 26
Lipase: 144

## 2011-09-22 LAB — COMPREHENSIVE METABOLIC PANEL
ALT: 9
ALT: 9
Albumin: 4.9
Alkaline Phosphatase: 88
BUN: 4 — ABNORMAL LOW
BUN: 6
CO2: 30
Calcium: 10.2
Calcium: 9.9
GFR calc non Af Amer: >60
Glucose, Bld: 100 — ABNORMAL HIGH
Potassium: 3.4 — ABNORMAL LOW
Sodium: 140
Sodium: 140
Total Protein: 8
Total Protein: 8.5 — ABNORMAL HIGH

## 2011-09-22 LAB — DIFFERENTIAL
Basophils Absolute: 0
Basophils Relative: 0
Basophils Relative: 0
Basophils Relative: 1
Eosinophils Absolute: 0.2
Eosinophils Absolute: 0.1
Eosinophils Relative: 2
Lymphs Abs: 2.5
Lymphs Abs: 2.8
Monocytes Absolute: 0.6
Monocytes Absolute: 0.7
Monocytes Relative: 6
Neutro Abs: 7.6
Neutro Abs: 8.5 — ABNORMAL HIGH
Neutrophils Relative %: 68
Neutrophils Relative %: 69

## 2011-09-22 LAB — URINE MICROSCOPIC-ADD ON

## 2011-09-22 LAB — POCT PREGNANCY, URINE: Preg Test, Ur: NEGATIVE

## 2011-09-22 LAB — POCT I-STAT, CHEM 8
Calcium, Ion: 1.27
Glucose, Bld: 81
HCT: 39
TCO2: 31

## 2011-09-22 LAB — PREGNANCY, URINE
Preg Test, Ur: NEGATIVE
Preg Test, Ur: NEGATIVE

## 2011-09-23 LAB — PREGNANCY, URINE: Preg Test, Ur: NEGATIVE

## 2011-09-23 LAB — DIFFERENTIAL
Basophils Absolute: 0.1
Lymphocytes Relative: 21
Neutro Abs: 8.3 — ABNORMAL HIGH
Neutrophils Relative %: 71

## 2011-09-23 LAB — URINALYSIS, ROUTINE W REFLEX MICROSCOPIC
Hgb urine dipstick: NEGATIVE
Nitrite: NEGATIVE
Protein, ur: NEGATIVE
Urobilinogen, UA: 1

## 2011-09-23 LAB — BASIC METABOLIC PANEL
BUN: 6
Calcium: 10.1
GFR calc non Af Amer: >60
Glucose, Bld: 91

## 2011-09-23 LAB — CBC
Platelets: 275
RDW: 15.5

## 2011-09-23 LAB — POCT CARDIAC MARKERS: Troponin i, poc: <0.05

## 2011-09-23 LAB — D-DIMER, QUANTITATIVE: D-Dimer, Quant: 0.7 — ABNORMAL HIGH

## 2011-09-23 LAB — URINE MICROSCOPIC-ADD ON

## 2011-09-25 LAB — URINALYSIS, ROUTINE W REFLEX MICROSCOPIC
Ketones, ur: NEGATIVE mg/dL
Nitrite: NEGATIVE
Protein, ur: NEGATIVE mg/dL
Urobilinogen, UA: 1 mg/dL (ref 0.0–1.0)
pH: 7 (ref 5.0–8.0)

## 2011-09-25 LAB — APTT: aPTT: 33 seconds (ref 24–37)

## 2011-09-25 LAB — RPR: RPR Ser Ql: NONREACTIVE

## 2011-09-25 LAB — DIFFERENTIAL
Basophils Absolute: 0.1 10*3/uL (ref 0.0–0.1)
Eosinophils Absolute: 0.2 10*3/uL (ref 0.0–0.7)
Eosinophils Relative: 2 % (ref 0–5)
Monocytes Absolute: 0.9 10*3/uL (ref 0.1–1.0)

## 2011-09-25 LAB — BASIC METABOLIC PANEL
BUN: 6 mg/dL (ref 6–23)
CO2: 27 mEq/L (ref 19–32)
Chloride: 101 mEq/L (ref 96–112)
Glucose, Bld: 85 mg/dL (ref 70–99)
Potassium: 3.8 mEq/L (ref 3.5–5.1)

## 2011-09-25 LAB — CBC
HCT: 39.3 % (ref 36.0–46.0)
Hemoglobin: 12.5 g/dL (ref 12.0–15.0)
MCV: 80.3 fL (ref 78.0–100.0)
Platelets: 247 10*3/uL (ref 150–400)
RDW: 16.6 % — ABNORMAL HIGH (ref 11.5–15.5)

## 2011-09-25 LAB — D-DIMER, QUANTITATIVE: D-Dimer, Quant: 0.76 ug/mL-FEU — ABNORMAL HIGH (ref 0.00–0.48)

## 2011-09-25 LAB — WET PREP, GENITAL: Trich, Wet Prep: NONE SEEN

## 2011-10-07 ENCOUNTER — Other Ambulatory Visit: Payer: Self-pay

## 2011-10-07 ENCOUNTER — Encounter (HOSPITAL_BASED_OUTPATIENT_CLINIC_OR_DEPARTMENT_OTHER): Payer: Self-pay

## 2011-10-07 ENCOUNTER — Emergency Department (HOSPITAL_BASED_OUTPATIENT_CLINIC_OR_DEPARTMENT_OTHER)
Admission: EM | Admit: 2011-10-07 | Discharge: 2011-10-07 | Disposition: A | Discharge status: Home / Self Care | Payer: Medicare Other | Attending: Emergency Medicine | Admitting: Emergency Medicine

## 2011-10-07 ENCOUNTER — Emergency Department (INDEPENDENT_AMBULATORY_CARE_PROVIDER_SITE_OTHER): Payer: Medicare Other

## 2011-10-07 DIAGNOSIS — M79609 Pain in unspecified limb: Secondary | ICD-10-CM | POA: Insufficient documentation

## 2011-10-07 DIAGNOSIS — Z79899 Other long term (current) drug therapy: Secondary | ICD-10-CM | POA: Insufficient documentation

## 2011-10-07 DIAGNOSIS — I10 Essential (primary) hypertension: Secondary | ICD-10-CM | POA: Insufficient documentation

## 2011-10-07 DIAGNOSIS — R51 Headache: Secondary | ICD-10-CM

## 2011-10-07 DIAGNOSIS — I509 Heart failure, unspecified: Secondary | ICD-10-CM | POA: Insufficient documentation

## 2011-10-07 DIAGNOSIS — M79605 Pain in left leg: Secondary | ICD-10-CM

## 2011-10-07 DIAGNOSIS — R5383 Other fatigue: Secondary | ICD-10-CM

## 2011-10-07 DIAGNOSIS — K5090 Crohn's disease, unspecified, without complications: Secondary | ICD-10-CM

## 2011-10-07 DIAGNOSIS — Z86718 Personal history of other venous thrombosis and embolism: Secondary | ICD-10-CM

## 2011-10-07 DIAGNOSIS — E049 Nontoxic goiter, unspecified: Secondary | ICD-10-CM | POA: Insufficient documentation

## 2011-10-07 DIAGNOSIS — R0602 Shortness of breath: Secondary | ICD-10-CM

## 2011-10-07 DIAGNOSIS — R079 Chest pain, unspecified: Secondary | ICD-10-CM

## 2011-10-07 HISTORY — DX: Other pulmonary embolism without acute cor pulmonale: I26.99

## 2011-10-07 HISTORY — DX: Heart failure, unspecified: I50.9

## 2011-10-07 LAB — URINALYSIS, ROUTINE W REFLEX MICROSCOPIC
Bilirubin Urine: NEGATIVE
Glucose, UA: NEGATIVE mg/dL
Hgb urine dipstick: NEGATIVE
Specific Gravity, Urine: 1.022 (ref 1.005–1.030)
Urobilinogen, UA: 1 mg/dL (ref 0.0–1.0)

## 2011-10-07 LAB — BASIC METABOLIC PANEL
BUN: 6 mg/dL (ref 6–23)
CO2: 27 mEq/L (ref 19–32)
Calcium: 9.8 mg/dL (ref 8.4–10.5)
Creatinine, Ser: 0.6 mg/dL (ref 0.50–1.10)
GFR calc non Af Amer: >90 mL/min (ref >90)
Glucose, Bld: 117 mg/dL — ABNORMAL HIGH (ref 70–99)
Sodium: 139 mEq/L (ref 135–145)

## 2011-10-07 LAB — DIFFERENTIAL
Eosinophils Absolute: 0.1 10*3/uL (ref 0.0–0.7)
Eosinophils Relative: 2 % (ref 0–5)
Lymphocytes Relative: 24 % (ref 12–46)
Lymphs Abs: 2 10*3/uL (ref 0.7–4.0)
Monocytes Absolute: 0.5 10*3/uL (ref 0.1–1.0)
Monocytes Relative: 6 % (ref 3–12)

## 2011-10-07 LAB — CBC
HCT: 34.6 % — ABNORMAL LOW (ref 36.0–46.0)
Hemoglobin: 11.6 g/dL — ABNORMAL LOW (ref 12.0–15.0)
MCH: 25.7 pg — ABNORMAL LOW (ref 26.0–34.0)
MCV: 76.5 fL — ABNORMAL LOW (ref 78.0–100.0)
Platelets: 304 10*3/uL (ref 150–400)
RBC: 4.52 MIL/uL (ref 3.87–5.11)
WBC: 8.1 10*3/uL (ref 4.0–10.5)

## 2011-10-07 LAB — CARDIAC PANEL(CRET KIN+CKTOT+MB+TROPI)
Relative Index: 1 (ref 0.0–2.5)
Total CK: 250 U/L — ABNORMAL HIGH (ref 7–177)

## 2011-10-07 MED ORDER — MORPHINE SULFATE 4 MG/ML IJ SOLN
4.0000 mg | Freq: Once | INTRAMUSCULAR | Status: AC
  Administered 2011-10-07: 4 mg via INTRAVENOUS
  Filled 2011-10-07: qty 1

## 2011-10-07 MED ORDER — IOHEXOL 350 MG/ML SOLN
80.0000 mL | Freq: Once | INTRAVENOUS | Status: AC | PRN
  Administered 2011-10-07: 80 mL via INTRAVENOUS

## 2011-10-07 MED ORDER — PROMETHAZINE HCL 25 MG/ML IJ SOLN
12.5000 mg | Freq: Once | INTRAMUSCULAR | Status: AC
  Administered 2011-10-07: 12.5 mg via INTRAVENOUS
  Filled 2011-10-07: qty 1

## 2011-10-07 NOTE — ED Notes (Signed)
Pt c/o CP, HA, leg pain, multiple syncopal events-NAD

## 2011-10-07 NOTE — ED Provider Notes (Signed)
History     CSN: [id number] Arrival date & time: 10/07/2011 11:16 AM   First MD Initiated Contact with Patient 10/07/11 1202      Chief Complaint  Patient presents with  . Chest Pain  . Headache  . Leg Pain    (Consider location/radiation/quality/duration/timing/severity/associated sxs/prior treatment) HPI Comments: Pt state that she think that she is passing out:pt states that she just knows that she is having left leg swelling and she has history of clots:pt states that she was seen by her obgyn yesterday and they told her   Patient is a 30 y.o. female presenting with headaches and chest pain. The history is provided by the patient. No language interpreter was used.  Headache  This is a recurrent problem. The current episode started more than 2 days ago. The problem occurs constantly. The problem has not changed since onset.The headache is associated with bright light. Pain location: entire head. The pain is severe. The pain does not radiate. Associated symptoms include syncope, nausea and vomiting.  Chest Pain The chest pain began 3 - 5 days ago. Chest pain occurs constantly. The chest pain is unchanged. The severity of the pain is moderate. The quality of the pain is described as aching. The pain does not radiate. Primary symptoms include syncope, nausea and vomiting.  Before the onset of the syncopal episode there was nausea. The syncopal episode occurred with headaches.  Pertinent negatives for associated symptoms include no diaphoresis and no numbness. She tried nothing for the symptoms.     Past Medical History  Diagnosis Date  . Crohn disease   . Abdominal pain, unspecified site   . Hypertension   . Diabetes mellitus   . Pancreatitis   . Fibromyalgia   . Migraine   . CHF (congestive heart failure)   . Pulmonary embolism     Past Surgical History  Procedure Date  . Breast surgery   . Tonsillectomy     No family history on file.  History  Substance Use Topics   . Smoking status: Never Smoker   . Smokeless tobacco: Not on file  . Alcohol Use: No    OB History    Grav Para Term Preterm Abortions TAB SAB Ect Mult Living                  Review of Systems  Constitutional: Negative for diaphoresis.  Cardiovascular: Positive for chest pain and syncope.  Gastrointestinal: Positive for nausea and vomiting.  Neurological: Positive for headaches. Negative for numbness.  All other systems reviewed and are negative.    Allergies  Caffeine; Compazine; Decadron; Flagyl; Reglan; and Zofran  Home Medications   Current Outpatient Rx  Name Route Sig Dispense Refill  . DIAZEPAM 5 MG PO TABS Oral Take 5 mg by mouth 2 (two) times daily as needed. For anxiety or back pain    . FLUTICASONE PROPIONATE 50 MCG/ACT NA SUSP Nasal Place 2 sprays into the nose daily.     Marland Kitchen LORATADINE 10 MG PO TABS Oral Take 10 mg by mouth daily.      . LUBIPROSTONE 8 MCG PO CAPS Oral Take 8 mcg by mouth 2 (two) times daily.      Marland Kitchen METFORMIN HCL 850 MG PO TABS Oral Take 850 mg by mouth 2 (two) times daily with a meal.      . NEBIVOLOL HCL 10 MG PO TABS Oral Take 10 mg by mouth daily.      . NEBIVOLOL HCL 5  MG PO TABS Oral Take 5 mg by mouth daily.      Marland Kitchen OLMESARTAN-AMLODIPINE-HCTZ 40-5-25 MG PO TABS Oral Take 1 tablet by mouth daily.      . TRAMADOL HCL 50 MG PO TABS Oral Take 50 mg by mouth 3 (three) times daily as needed. For pain     . ZOLPIDEM TARTRATE 10 MG PO TABS Oral Take 10 mg by mouth at bedtime.        BP 121/74  Pulse 94  Temp(Src) 98.4 F (36.9 C) (Oral)  Resp 16  Ht 5\' 6"  (1.676 m)  Wt 213 lb (96.616 kg)  BMI 34.38 kg/m2  SpO2 100%  LMP 09/24/2011  Physical Exam  Nursing note and vitals reviewed. Constitutional: She appears well-developed and well-nourished.  HENT:  Head: Normocephalic and atraumatic.  Eyes: Pupils are equal, round, and reactive to light.  Neck: Normal range of motion. Neck supple.  Cardiovascular: Normal rate and regular rhythm.     Pulmonary/Chest: Effort normal.  Abdominal: Soft. Bowel sounds are normal.  Musculoskeletal: Normal range of motion.       No obvious swelling noted to left lower extremity  Neurological: She is alert.  Skin: Skin is warm and dry.    ED Course  Procedures (including critical care time)  Labs Reviewed  CBC - Abnormal; Notable for the following:    Hemoglobin 11.6 (*)    HCT 34.6 (*)    MCV 76.5 (*)    MCH 25.7 (*)    RDW 15.8 (*)    All other components within normal limits  BASIC METABOLIC PANEL - Abnormal; Notable for the following:    Glucose, Bld 117 (*)    All other components within normal limits  D-DIMER, QUANTITATIVE - Abnormal; Notable for the following:    D-Dimer, Quant 0.66 (*)    All other components within normal limits  CARDIAC PANEL(CRET KIN+CKTOT+MB+TROPI) - Abnormal; Notable for the following:    Total CK 250 (*)    All other components within normal limits  PREGNANCY, URINE  URINALYSIS, ROUTINE W REFLEX MICROSCOPIC  DIFFERENTIAL   Ct Angio Chest W/cm &/or Wo Cm  10/07/2011  *RADIOLOGY REPORT*  Clinical Data:  Chest pain, leg pain, generalized body weakness, Crohn's disease  CT ANGIOGRAPHY CHEST WITH CONTRAST  Technique:  Multidetector CT imaging of the chest was performed using the standard protocol during bolus administration of intravenous contrast.  Multiplanar CT image reconstructions including MIPs were obtained to evaluate the vascular anatomy.  Contrast: 80mL OMNIPAQUE IOHEXOL 350 MG/ML IV SOLN  Comparison:  Chest x-ray of 09/03/2011 and CT chest of 09/09/2009  Findings:  The pulmonary arteries are moderately well opacified. There is no evidence of acute pulmonary embolism.  The thoracic aorta opacifies with no acute abnormality noted.  The origins of the great vessels are patent.  No mediastinal or hilar adenopathy is seen.  The heart is within upper limits of normal.  No abnormality of the upper abdomen is seen.  There does appear to be asymmetric  enlargement of the left lobe of thyroid.  On the lung window images, no lung parenchymal abnormality is seen. No effusion is noted.  No bony abnormality is seen.  Review of the MIP images confirms the above findings.  IMPRESSION:  1.  No evidence of acute pulmonary embolism. 2.  No infiltrate, nodule, or pleural effusion is seen. 3.  Probable slight asymmetric enlargement of the left lobe thyroid.  Original Report Authenticated By: Juline Patch, M.D.  US Venous Img Lower Unilateral Left  10/07/2011  *RADIOLOGY REPORT*  Clinical Data: Left leg pain, shortness of breath, past history pulmonary embolism  LEFT LOWER EXTREMITY VENOUS DUPLEX ULTRASOUND  Technique:  Gray-scale sonography with graded compression, as well as color Doppler and duplex ultrasound, were performed to evaluate the deep venous system of the lower extremity from the level of the common femoral vein through the popliteal and proximal calf veins. Spectral Doppler was utilized to evaluate flow at rest and with distal augmentation maneuvers.  Comparison:  12/03/2008  Findings: Deep venous system patent and compressible from left groin through popliteal fossa. Spontaneous venous flow present with intact augmentation and evidence of respiratory phasicity. No intraluminal thrombus identified. Visualized portion of the greater saphenous system unremarkable. Greater saphenous vein could not be localized at the level of the knee.  IMPRESSION: No evidence of deep venous thrombosis in left lower extremity.  Original Report Authenticated By: Lollie Marrow, M.D.    Date: 10/07/2011  Rate: 90  Rhythm: normal sinus rhythm  QRS Axis: left  Intervals: normal  ST/T Wave abnormalities: normal  Conduction Disutrbances:none  Narrative Interpretation:   Old EKG Reviewed: none available    1. Headache   2. Chest pain   3. Thyroid enlargement   4. Leg pain, left       MDM  Pt does not have a clot in the lung on the HQI:[id number] finding of  thyroid with the pt:pt headache is better after the medication:pt unable to articulate why she thinks that she is having syncopal episodes or is she is having them        Teressa Lower, NP 10/07/11 1324

## 2011-10-07 NOTE — ED Notes (Signed)
C/o CP ,HA enter left leg pain x 5 days-states she was seen GYN yesterday-was advised to come to ED

## 2011-10-07 NOTE — ED Provider Notes (Signed)
History     CSN: [id number] Arrival date & time: 10/07/2011 11:16 AM   First MD Initiated Contact with Patient 10/07/11 1202      Chief Complaint  Patient presents with  . Chest Pain  . Headache  . Leg Pain    (Consider location/radiation/quality/duration/timing/severity/associated sxs/prior treatment) HPI  Past Medical History  Diagnosis Date  . Crohn disease   . Abdominal pain, unspecified site   . Hypertension   . Diabetes mellitus   . Pancreatitis   . Fibromyalgia   . Migraine   . CHF (congestive heart failure)   . Pulmonary embolism     Past Surgical History  Procedure Date  . Breast surgery   . Tonsillectomy     No family history on file.  History  Substance Use Topics  . Smoking status: Never Smoker   . Smokeless tobacco: Not on file  . Alcohol Use: No    OB History    Grav Para Term Preterm Abortions TAB SAB Ect Mult Living                  Review of Systems  Allergies  Caffeine; Compazine; Decadron; Flagyl; Reglan; and Zofran  Home Medications   Current Outpatient Rx  Name Route Sig Dispense Refill  . DIAZEPAM 5 MG PO TABS Oral Take 5 mg by mouth 2 (two) times daily as needed. For anxiety or back pain    . FLUTICASONE PROPIONATE 50 MCG/ACT NA SUSP Nasal Place 2 sprays into the nose daily.     Marland Kitchen LORATADINE 10 MG PO TABS Oral Take 10 mg by mouth daily.      . LUBIPROSTONE 8 MCG PO CAPS Oral Take 8 mcg by mouth 2 (two) times daily.      Marland Kitchen METFORMIN HCL 850 MG PO TABS Oral Take 850 mg by mouth 2 (two) times daily with a meal.      . NEBIVOLOL HCL 10 MG PO TABS Oral Take 10 mg by mouth daily.      . NEBIVOLOL HCL 5 MG PO TABS Oral Take 5 mg by mouth daily.      Marland Kitchen OLMESARTAN-AMLODIPINE-HCTZ 40-5-25 MG PO TABS Oral Take 1 tablet by mouth daily.      . TRAMADOL HCL 50 MG PO TABS Oral Take 50 mg by mouth 3 (three) times daily as needed. For pain     . ZOLPIDEM TARTRATE 10 MG PO TABS Oral Take 10 mg by mouth at bedtime.        BP 130/83  Pulse  89  Temp(Src) 98.4 F (36.9 C) (Oral)  Resp 16  Ht 5\' 6"  (1.676 m)  Wt 213 lb (96.616 kg)  BMI 34.38 kg/m2  SpO2 100%  LMP 09/24/2011  Physical Exam  ED Course  Procedures (including critical care time)  Labs Reviewed  CBC - Abnormal; Notable for the following:    Hemoglobin 11.6 (*)    HCT 34.6 (*)    MCV 76.5 (*)    MCH 25.7 (*)    RDW 15.8 (*)    All other components within normal limits  BASIC METABOLIC PANEL - Abnormal; Notable for the following:    Glucose, Bld 117 (*)    All other components within normal limits  D-DIMER, QUANTITATIVE - Abnormal; Notable for the following:    D-Dimer, Quant 0.66 (*)    All other components within normal limits  CARDIAC PANEL(CRET KIN+CKTOT+MB+TROPI) - Abnormal; Notable for the following:    Total CK 250 (*)  All other components within normal limits  PREGNANCY, URINE  URINALYSIS, ROUTINE W REFLEX MICROSCOPIC  DIFFERENTIAL   Ct Angio Chest W/cm &/or Wo Cm  10/07/2011  *RADIOLOGY REPORT*  Clinical Data:  Chest pain, leg pain, generalized body weakness, Crohn's disease  CT ANGIOGRAPHY CHEST WITH CONTRAST  Technique:  Multidetector CT imaging of the chest was performed using the standard protocol during bolus administration of intravenous contrast.  Multiplanar CT image reconstructions including MIPs were obtained to evaluate the vascular anatomy.  Contrast: 80mL OMNIPAQUE IOHEXOL 350 MG/ML IV SOLN  Comparison:  Chest x-ray of 09/03/2011 and CT chest of 09/09/2009  Findings:  The pulmonary arteries are moderately well opacified. There is no evidence of acute pulmonary embolism.  The thoracic aorta opacifies with no acute abnormality noted.  The origins of the great vessels are patent.  No mediastinal or hilar adenopathy is seen.  The heart is within upper limits of normal.  No abnormality of the upper abdomen is seen.  There does appear to be asymmetric enlargement of the left lobe of thyroid.  On the lung window images, no lung parenchymal  abnormality is seen. No effusion is noted.  No bony abnormality is seen.  Review of the MIP images confirms the above findings.  IMPRESSION:  1.  No evidence of acute pulmonary embolism. 2.  No infiltrate, nodule, or pleural effusion is seen. 3.  Probable slight asymmetric enlargement of the left lobe thyroid.  Original Report Authenticated By: Juline Patch, M.D.   US Venous Img Lower Unilateral Left  10/07/2011  *RADIOLOGY REPORT*  Clinical Data: Left leg pain, shortness of breath, past history pulmonary embolism  LEFT LOWER EXTREMITY VENOUS DUPLEX ULTRASOUND  Technique:  Gray-scale sonography with graded compression, as well as color Doppler and duplex ultrasound, were performed to evaluate the deep venous system of the lower extremity from the level of the common femoral vein through the popliteal and proximal calf veins. Spectral Doppler was utilized to evaluate flow at rest and with distal augmentation maneuvers.  Comparison:  12/03/2008  Findings: Deep venous system patent and compressible from left groin through popliteal fossa. Spontaneous venous flow present with intact augmentation and evidence of respiratory phasicity. No intraluminal thrombus identified. Visualized portion of the greater saphenous system unremarkable. Greater saphenous vein could not be localized at the level of the knee.  IMPRESSION: No evidence of deep venous thrombosis in left lower extremity.  Original Report Authenticated By: Lollie Marrow, M.D.     1. Headache   2. Chest pain   3. Thyroid enlargement   4. Leg pain, left       MDM    Medical screening examination/treatment/procedure(s) were performed by non-physician practitioner and as supervising physician I was immediately available for consultation/collaboration.       Gwyneth Sprout, MD 10/07/11 [phone]

## 2012-01-26 ENCOUNTER — Encounter (HOSPITAL_BASED_OUTPATIENT_CLINIC_OR_DEPARTMENT_OTHER): Payer: Self-pay

## 2012-01-26 ENCOUNTER — Emergency Department (HOSPITAL_BASED_OUTPATIENT_CLINIC_OR_DEPARTMENT_OTHER)
Admission: EM | Admit: 2012-01-26 | Discharge: 2012-01-26 | Disposition: A | Discharge status: Home / Self Care | Payer: Medicare Other | Attending: Emergency Medicine | Admitting: Emergency Medicine

## 2012-01-26 DIAGNOSIS — IMO0001 Reserved for inherently not codable concepts without codable children: Secondary | ICD-10-CM | POA: Insufficient documentation

## 2012-01-26 DIAGNOSIS — M549 Dorsalgia, unspecified: Secondary | ICD-10-CM | POA: Insufficient documentation

## 2012-01-26 DIAGNOSIS — R110 Nausea: Secondary | ICD-10-CM | POA: Insufficient documentation

## 2012-01-26 DIAGNOSIS — I509 Heart failure, unspecified: Secondary | ICD-10-CM | POA: Insufficient documentation

## 2012-01-26 DIAGNOSIS — E119 Type 2 diabetes mellitus without complications: Secondary | ICD-10-CM | POA: Insufficient documentation

## 2012-01-26 DIAGNOSIS — Z79899 Other long term (current) drug therapy: Secondary | ICD-10-CM | POA: Insufficient documentation

## 2012-01-26 DIAGNOSIS — I10 Essential (primary) hypertension: Secondary | ICD-10-CM | POA: Insufficient documentation

## 2012-01-26 DIAGNOSIS — G43909 Migraine, unspecified, not intractable, without status migrainosus: Secondary | ICD-10-CM | POA: Insufficient documentation

## 2012-01-26 DIAGNOSIS — K5090 Crohn's disease, unspecified, without complications: Secondary | ICD-10-CM | POA: Insufficient documentation

## 2012-01-26 HISTORY — DX: Other chronic pain: G89.29

## 2012-01-26 MED ORDER — HYDROMORPHONE HCL PF 2 MG/ML IJ SOLN
2.0000 mg | Freq: Once | INTRAMUSCULAR | Status: AC
  Administered 2012-01-26: 2 mg via INTRAVENOUS
  Filled 2012-01-26: qty 1

## 2012-01-26 MED ORDER — HYDROMORPHONE HCL PF 1 MG/ML IJ SOLN
1.0000 mg | Freq: Once | INTRAMUSCULAR | Status: AC
  Administered 2012-01-26: 1 mg via INTRAVENOUS

## 2012-01-26 MED ORDER — HYDROMORPHONE HCL PF 1 MG/ML IJ SOLN
INTRAMUSCULAR | Status: AC
  Administered 2012-01-26: 1 mg via INTRAVENOUS
  Filled 2012-01-26: qty 1

## 2012-01-26 MED ORDER — PROMETHAZINE HCL 25 MG/ML IJ SOLN
25.0000 mg | Freq: Once | INTRAMUSCULAR | Status: AC
  Administered 2012-01-26: 25 mg via INTRAVENOUS
  Filled 2012-01-26: qty 1

## 2012-01-26 MED ORDER — SODIUM CHLORIDE 0.9 % IV SOLN
INTRAVENOUS | Status: DC
  Administered 2012-01-26: 14:00:00 via INTRAVENOUS

## 2012-01-26 NOTE — ED Notes (Signed)
PO fluids provided. 

## 2012-01-26 NOTE — ED Provider Notes (Signed)
History     CSN: [id number]  Arrival date & time 01/26/12  1241   First MD Initiated Contact with Patient 01/26/12 1309      Chief Complaint  Patient presents with  . Migraine  . Back Pain  . Nausea    (Consider location/radiation/quality/duration/timing/severity/associated sxs/prior treatment) Patient is a 31 y.o. female presenting with migraine and back pain. The history is provided by the patient and medical records. No language interpreter was used.  Migraine This is a recurrent problem. The current episode started more than 2 days ago (Patient complains of several days of diffuse headache, which she says is her migraine headache. She's had migraine headaches since age 64. She has been vomiting, and feels dehydrated. She has taken Percocet without relief. She feels dehydrated.  ). The problem occurs constantly. The problem has not changed since onset.Associated symptoms include headaches. Associated symptoms comments: Backache.. Exacerbated bySherlynn Stalls. The symptoms are relieved by nothing. Treatments tried: She took Percocet without improvement.  Back Pain  Associated symptoms include headaches.    Past Medical History  Diagnosis Date  . Crohn disease   . Abdominal pain, unspecified site   . Hypertension   . Diabetes mellitus   . Pancreatitis   . Fibromyalgia   . Migraine   . CHF (congestive heart failure)   . Pulmonary embolism   . Chronic pain     Past Surgical History  Procedure Date  . Breast surgery   . Tonsillectomy     History reviewed. No pertinent family history.  History  Substance Use Topics  . Smoking status: Never Smoker   . Smokeless tobacco: Not on file  . Alcohol Use: No    OB History    Grav Para Term Preterm Abortions TAB SAB Ect Mult Living                  Review of Systems  Constitutional: Negative.  Negative for chills and fatigue.  Eyes: Negative.   Respiratory: Negative.   Cardiovascular: Negative.   Gastrointestinal: Negative.    Genitourinary: Negative.   Musculoskeletal: Positive for back pain.  Skin: Negative.   Neurological: Positive for headaches.  Psychiatric/Behavioral: Negative.     Allergies  Caffeine; Compazine; Decadron; Flagyl; Reglan; and Zofran  Home Medications   Current Outpatient Rx  Name Route Sig Dispense Refill  . OXYCODONE-ACETAMINOPHEN 10-325 MG PO TABS Oral Take 1 tablet by mouth every 4 (four) hours as needed.    Marland Kitchen DIAZEPAM 5 MG PO TABS Oral Take 5 mg by mouth 2 (two) times daily as needed. For anxiety or back pain    . FLUTICASONE PROPIONATE 50 MCG/ACT NA SUSP Nasal Place 2 sprays into the nose daily.     Marland Kitchen LORATADINE 10 MG PO TABS Oral Take 10 mg by mouth daily.      . LUBIPROSTONE 8 MCG PO CAPS Oral Take 8 mcg by mouth 2 (two) times daily.      Marland Kitchen METFORMIN HCL 850 MG PO TABS Oral Take 850 mg by mouth 2 (two) times daily with a meal.      . NEBIVOLOL HCL 10 MG PO TABS Oral Take 10 mg by mouth daily.      . NEBIVOLOL HCL 5 MG PO TABS Oral Take 5 mg by mouth daily.      Marland Kitchen OLMESARTAN-AMLODIPINE-HCTZ 40-5-25 MG PO TABS Oral Take 1 tablet by mouth daily.      . TRAMADOL HCL 50 MG PO TABS Oral Take 50 mg  by mouth 3 (three) times daily as needed. For pain     . ZOLPIDEM TARTRATE 10 MG PO TABS Oral Take 10 mg by mouth at bedtime.        BP 140/83  Pulse 87  Temp(Src) 98.8 F (37.1 C) (Oral)  Resp 16  Ht 5\' 6"  (1.676 m)  Wt 213 lb (96.616 kg)  BMI 34.38 kg/m2  SpO2 99%  LMP 01/15/2012  Physical Exam  Nursing note and vitals reviewed. Constitutional: She is oriented to person, place, and time. She appears well-developed and well-nourished.       In moderate distress with headache and back pain.    HENT:  Head: Normocephalic and atraumatic.  Right Ear: External ear normal.  Left Ear: External ear normal.       Mouth is dry with the tongue coated.  Eyes: Conjunctivae and EOM are normal. Pupils are equal, round, and reactive to light.  Neck: Normal range of motion. Neck supple.  No JVD present.  Cardiovascular: Normal rate, regular rhythm and normal heart sounds.   Pulmonary/Chest: Effort normal and breath sounds normal.  Abdominal: Soft. Bowel sounds are normal.  Musculoskeletal: Normal range of motion.       Pt  Localizes pain to the left lower rib cage and to the lower back in the area of L5.  There is no bony deformity or point tenderness in either location.   Lymphadenopathy:    She has no cervical adenopathy.  Neurological: She is alert and oriented to person, place, and time.       No sensory or motor deficit.  Skin: Skin is warm and dry.  Psychiatric: She has a normal mood and affect. Her behavior is normal.    ED Course  Procedures (including critical care time)  1:33 PM Patient was seen and had physical examination. Review of her medications shows she has allergies claimed to many commonly ordered migraine medications.  Check of the [place] controlled substances data base shows that she gets a prescription for 90 Percocet 10-350 every month, prescribed by Dr. Kathrynn Speed. Will give her IV fluids, and a dose of IV Dilaudid and Phenergan. She will need to follow with Dr. Kathrynn Speed for her migraine and chronic pain.  2:39 PM Doing better, but still complaining of headache.  Rates pain a 5 now.  I advised her we would give her one additional dose of pain medicine.  Then she can be released, to followup with Dr. Kathrynn Speed.   1. Migraine headache           Carleene Cooper III, MD 01/26/12 [phone]

## 2012-01-26 NOTE — ED Notes (Signed)
Pt reports a headache x 1 week unrelieved by Percocet and phenergan.  She also c/o nausea and back pain.

## 2012-02-16 ENCOUNTER — Encounter (HOSPITAL_BASED_OUTPATIENT_CLINIC_OR_DEPARTMENT_OTHER): Payer: Self-pay | Admitting: *Deleted

## 2012-02-16 ENCOUNTER — Emergency Department (HOSPITAL_BASED_OUTPATIENT_CLINIC_OR_DEPARTMENT_OTHER)
Admission: EM | Admit: 2012-02-16 | Discharge: 2012-02-17 | Disposition: A | Discharge status: Home / Self Care | Payer: Medicare Other | Attending: Emergency Medicine | Admitting: Emergency Medicine

## 2012-02-16 DIAGNOSIS — M549 Dorsalgia, unspecified: Secondary | ICD-10-CM | POA: Insufficient documentation

## 2012-02-16 DIAGNOSIS — R1110 Vomiting, unspecified: Secondary | ICD-10-CM | POA: Insufficient documentation

## 2012-02-16 DIAGNOSIS — E119 Type 2 diabetes mellitus without complications: Secondary | ICD-10-CM | POA: Insufficient documentation

## 2012-02-16 DIAGNOSIS — I10 Essential (primary) hypertension: Secondary | ICD-10-CM | POA: Insufficient documentation

## 2012-02-16 DIAGNOSIS — R109 Unspecified abdominal pain: Secondary | ICD-10-CM | POA: Insufficient documentation

## 2012-02-16 DIAGNOSIS — I509 Heart failure, unspecified: Secondary | ICD-10-CM | POA: Insufficient documentation

## 2012-02-16 DIAGNOSIS — Z79899 Other long term (current) drug therapy: Secondary | ICD-10-CM | POA: Insufficient documentation

## 2012-02-16 DIAGNOSIS — K5090 Crohn's disease, unspecified, without complications: Secondary | ICD-10-CM | POA: Insufficient documentation

## 2012-02-16 LAB — COMPREHENSIVE METABOLIC PANEL
ALT: 14 U/L (ref 0–35)
CO2: 27 mEq/L (ref 19–32)
Calcium: 10 mg/dL (ref 8.4–10.5)
Chloride: 99 mEq/L (ref 96–112)
Creatinine, Ser: 0.6 mg/dL (ref 0.50–1.10)
GFR calc Af Amer: >90 mL/min (ref >90)
GFR calc non Af Amer: >90 mL/min (ref >90)
Glucose, Bld: 141 mg/dL — ABNORMAL HIGH (ref 70–99)
Total Bilirubin: 0.2 mg/dL — ABNORMAL LOW (ref 0.3–1.2)

## 2012-02-16 LAB — URINALYSIS, ROUTINE W REFLEX MICROSCOPIC
Hgb urine dipstick: NEGATIVE
Ketones, ur: NEGATIVE mg/dL
Protein, ur: NEGATIVE mg/dL
Urobilinogen, UA: 0.2 mg/dL (ref 0.0–1.0)

## 2012-02-16 LAB — CBC
Hemoglobin: 11 g/dL — ABNORMAL LOW (ref 12.0–15.0)
MCH: 26 pg (ref 26.0–34.0)
MCV: 76.6 fL — ABNORMAL LOW (ref 78.0–100.0)
RBC: 4.23 MIL/uL (ref 3.87–5.11)

## 2012-02-16 LAB — PREGNANCY, URINE: Preg Test, Ur: NEGATIVE

## 2012-02-16 MED ORDER — PROMETHAZINE HCL 25 MG/ML IJ SOLN
25.0000 mg | Freq: Four times a day (QID) | INTRAMUSCULAR | Status: DC | PRN
  Administered 2012-02-16: 25 mg via INTRAVENOUS
  Filled 2012-02-16: qty 1

## 2012-02-16 MED ORDER — SODIUM CHLORIDE 0.9 % IV BOLUS (SEPSIS)
1000.0000 mL | Freq: Once | INTRAVENOUS | Status: AC
  Administered 2012-02-16: 1000 mL via INTRAVENOUS

## 2012-02-16 NOTE — ED Notes (Signed)
Pt c/o abd pain , vomiting and back pain x 1 month, seen here several times for same

## 2012-02-17 LAB — RAPID URINE DRUG SCREEN, HOSP PERFORMED
Amphetamines: NOT DETECTED
Barbiturates: NOT DETECTED
Tetrahydrocannabinol: NOT DETECTED

## 2012-02-17 MED ORDER — PROMETHAZINE HCL 25 MG PO TABS: 25.0000 mg | ORAL_TABLET | Freq: Four times a day (QID) | ORAL | Status: DC | PRN

## 2012-02-17 MED ORDER — MORPHINE SULFATE 4 MG/ML IJ SOLN
4.0000 mg | Freq: Once | INTRAMUSCULAR | Status: AC
  Administered 2012-02-17: 4 mg via INTRAVENOUS
  Filled 2012-02-17: qty 1

## 2012-02-17 NOTE — ED Provider Notes (Signed)
History     CSN: [id number]  Arrival date & time 02/16/12  2224   First MD Initiated Contact with Patient 02/16/12 2301      Chief Complaint  Patient presents with  . Abdominal Pain  . Emesis  . Back Pain    The history is provided by the patient.   the patient reports abdominal pain with intermittent nausea vomiting and upper back pain x1 month.  She's been seen in the ER for this without a definitive diagnosis and she is seeing her PCP as well.  She does report a long-standing history of abdominal pain having seen gastroenterologist for the past several years without a diagnosis as to why she has abdominal pain.  She has a history of fibromyalgia and she reports a history of pancreatitis for for the past.  Is reported in the nursing notes that she has a history of Crohn's disease however it appears as though there is a family history of Crohn's disease but the patient has never been definitively diagnosed with Crohn's disease.  She reports she's had no vomiting in the past 24 hours.  She reports she used to be on chronic pain medicine prescribed by her primary care doctor but reports that she no longer takes this as she wants to become pregnant.  She's never been diagnosed with cholelithiasis.  She still has her gallbladder.  She reports her discomfort is occasionally worsened by food.  Is improved by nothing.  She's never been diagnosed with gastroparesis.  Past Medical History  Diagnosis Date  . Crohn disease   . Abdominal pain, unspecified site   . Hypertension   . Diabetes mellitus   . Pancreatitis   . Fibromyalgia   . Migraine   . CHF (congestive heart failure)   . Pulmonary embolism   . Chronic pain     Past Surgical History  Procedure Date  . Breast surgery   . Tonsillectomy     History reviewed. No pertinent family history.  History  Substance Use Topics  . Smoking status: Never Smoker   . Smokeless tobacco: Not on file  . Alcohol Use: No    OB History    Grav Para Term Preterm Abortions TAB SAB Ect Mult Living                  Review of Systems  Gastrointestinal: Positive for vomiting and abdominal pain.  Musculoskeletal: Positive for back pain.  All other systems reviewed and are negative.    Allergies  Caffeine; Compazine; Decadron; Flagyl; Reglan; and Zofran  Home Medications   Current Outpatient Rx  Name Route Sig Dispense Refill  . ACETAMINOPHEN 500 MG PO TABS Oral Take 1,000 mg by mouth every 6 (six) hours as needed. For pain    . CLONIDINE HCL 0.1 MG PO TABS Oral Take 0.1 mg by mouth daily as needed. For high blood pressure    . DIAZEPAM 5 MG PO TABS Oral Take 5 mg by mouth 2 (two) times daily as needed. For anxiety or back pain    . FLUTICASONE PROPIONATE 50 MCG/ACT NA SUSP Nasal Place 2 sprays into the nose daily.     Marland Kitchen LORATADINE 10 MG PO TABS Oral Take 10 mg by mouth daily.      . NEBIVOLOL HCL 5 MG PO TABS Oral Take 5 mg by mouth daily.      Marland Kitchen OLMESARTAN-AMLODIPINE-HCTZ 40-5-25 MG PO TABS Oral Take 1 tablet by mouth daily.      Marland Kitchen  OXYCODONE-ACETAMINOPHEN 10-325 MG PO TABS Oral Take 1 tablet by mouth every 4 (four) hours as needed. For pain    . CHEWABLE MULTIPLE VITAMINS PO Oral Take 1 tablet by mouth daily.    Marland Kitchen VITAMIN D (ERGOCALCIFEROL) 50000 UNITS PO CAPS Oral Take 50,000 Units by mouth every 7 (seven) days. On Tuesday    . ZOLPIDEM TARTRATE 10 MG PO TABS Oral Take 10 mg by mouth at bedtime as needed. For sleep      BP 111/77  Pulse 102  Temp(Src) 98 F (36.7 C) (Oral)  Resp 18  Ht 5\' 6"  (1.676 m)  Wt 213 lb (96.616 kg)  BMI 34.38 kg/m2  SpO2 100%  LMP 01/15/2012  Physical Exam  Nursing note and vitals reviewed. Constitutional: She is oriented to person, place, and time. She appears well-developed and well-nourished. No distress.  HENT:  Head: Normocephalic and atraumatic.  Eyes: EOM are normal.  Neck: Normal range of motion.  Cardiovascular: Normal rate, regular rhythm and normal heart sounds.     Pulmonary/Chest: Effort normal and breath sounds normal.  Abdominal: Soft. She exhibits no distension.       Mild epigastric tenderness without guarding or rebound.  No tenderness in her upper quad  Musculoskeletal: Normal range of motion.  Neurological: She is alert and oriented to person, place, and time.  Skin: Skin is warm and dry.  Psychiatric: She has a normal mood and affect. Judgment normal.    ED Course  Procedures (including critical care time)  Labs Reviewed  CBC - Abnormal; Notable for the following:    WBC 12.1 (*)    Hemoglobin 11.0 (*)    HCT 32.4 (*)    MCV 76.6 (*)    RDW 16.6 (*)    All other components within normal limits  COMPREHENSIVE METABOLIC PANEL - Abnormal; Notable for the following:    Glucose, Bld 141 (*)    Total Bilirubin 0.2 (*)    All other components within normal limits  URINE RAPID DRUG SCREEN (HOSP PERFORMED) - Abnormal; Notable for the following:    Benzodiazepines POSITIVE (*)    All other components within normal limits  LIPASE, BLOOD  URINALYSIS, ROUTINE W REFLEX MICROSCOPIC  PREGNANCY, URINE   No results found.   1. Abdominal pain       MDM  The patient's labs and urine are without significant abnormality.  Her white count is 12,000 it is nonspecific.  At this time I do not believe she needs imaging of her abdomen.  She is a complex patient with multiple medical problems including a long-standing history of abdominal pain.  Her last the patient see her physician tomorrow as I believe her care is best managed in the outpatient setting.  IW there to be a life-threatening event tonight that would require emergent imaging.  Her vital signs are normal.  She is discharged home in good condition.  She feels better at this time.  She reports she has Percocet at home for her pain.  I will send her home with a prescription for antibiotics.        Lyanne Co, MD 02/17/12 [phone]

## 2012-02-17 NOTE — Discharge Instructions (Signed)

## 2012-03-30 ENCOUNTER — Emergency Department (HOSPITAL_BASED_OUTPATIENT_CLINIC_OR_DEPARTMENT_OTHER)
Admission: EM | Admit: 2012-03-30 | Discharge: 2012-03-30 | Disposition: A | Discharge status: Home / Self Care | Payer: Medicare Other | Attending: Emergency Medicine | Admitting: Emergency Medicine

## 2012-03-30 ENCOUNTER — Encounter (HOSPITAL_BASED_OUTPATIENT_CLINIC_OR_DEPARTMENT_OTHER): Payer: Self-pay | Admitting: *Deleted

## 2012-03-30 DIAGNOSIS — R51 Headache: Secondary | ICD-10-CM | POA: Insufficient documentation

## 2012-03-30 DIAGNOSIS — I10 Essential (primary) hypertension: Secondary | ICD-10-CM | POA: Insufficient documentation

## 2012-03-30 DIAGNOSIS — K5090 Crohn's disease, unspecified, without complications: Secondary | ICD-10-CM | POA: Insufficient documentation

## 2012-03-30 DIAGNOSIS — Z79899 Other long term (current) drug therapy: Secondary | ICD-10-CM | POA: Insufficient documentation

## 2012-03-30 DIAGNOSIS — E119 Type 2 diabetes mellitus without complications: Secondary | ICD-10-CM | POA: Insufficient documentation

## 2012-03-30 DIAGNOSIS — I509 Heart failure, unspecified: Secondary | ICD-10-CM | POA: Insufficient documentation

## 2012-03-30 DIAGNOSIS — R1110 Vomiting, unspecified: Secondary | ICD-10-CM | POA: Insufficient documentation

## 2012-03-30 DIAGNOSIS — G8929 Other chronic pain: Secondary | ICD-10-CM | POA: Insufficient documentation

## 2012-03-30 MED ORDER — PROMETHAZINE HCL 25 MG/ML IJ SOLN
25.0000 mg | Freq: Once | INTRAMUSCULAR | Status: AC
  Administered 2012-03-30: 25 mg via INTRAVENOUS

## 2012-03-30 MED ORDER — SODIUM CHLORIDE 0.9 % IV BOLUS (SEPSIS)
1000.0000 mL | Freq: Once | INTRAVENOUS | Status: AC
  Administered 2012-03-30: 1000 mL via INTRAVENOUS

## 2012-03-30 MED ORDER — OXYCODONE-ACETAMINOPHEN 5-325 MG PO TABS
2.0000 | ORAL_TABLET | Freq: Once | ORAL | Status: AC
  Administered 2012-03-30: 2 via ORAL
  Filled 2012-03-30: qty 2

## 2012-03-30 MED ORDER — PROMETHAZINE HCL 25 MG/ML IJ SOLN
INTRAMUSCULAR | Status: AC
  Administered 2012-03-30: 25 mg via INTRAVENOUS
  Filled 2012-03-30: qty 1

## 2012-03-30 MED ORDER — DIPHENHYDRAMINE HCL 50 MG/ML IJ SOLN
25.0000 mg | Freq: Once | INTRAMUSCULAR | Status: AC
  Administered 2012-03-30: 25 mg via INTRAVENOUS
  Filled 2012-03-30: qty 1

## 2012-03-30 MED ORDER — KETOROLAC TROMETHAMINE 30 MG/ML IJ SOLN
30.0000 mg | Freq: Once | INTRAMUSCULAR | Status: AC
  Administered 2012-03-30: 30 mg via INTRAVENOUS
  Filled 2012-03-30: qty 1

## 2012-03-30 MED ORDER — FENTANYL CITRATE 0.05 MG/ML IJ SOLN
50.0000 ug | Freq: Once | INTRAMUSCULAR | Status: AC
  Administered 2012-03-30: 50 ug via INTRAVENOUS
  Filled 2012-03-30: qty 2

## 2012-03-30 MED ORDER — PROMETHAZINE HCL 25 MG/ML IJ SOLN
25.0000 mg | Freq: Once | INTRAMUSCULAR | Status: AC
  Administered 2012-03-30: 25 mg via INTRAVENOUS
  Filled 2012-03-30: qty 1

## 2012-03-30 NOTE — ED Notes (Signed)
Pt amb to room 8 with quick steady gait in nad. Pt reports headache and vomiting x 2 days, her typical migraine sx, this am awoke with nosebleed.

## 2012-03-30 NOTE — Discharge Instructions (Signed)

## 2012-03-30 NOTE — ED Provider Notes (Addendum)
History     CSN: [id number]  Arrival date & time 03/30/12  1008   First MD Initiated Contact with Patient 03/30/12 1120      Chief Complaint  Patient presents with  . Emesis  . Headache  . Nose Problem    (Consider location/radiation/quality/duration/timing/severity/associated sxs/prior treatment) HPI Comments: Patient presents with a typical migraine for her.  She states it began 2 days ago and is associated with nausea and vomiting.  Her migraines are normally associated with nausea and vomiting.  No fevers.  No focal abdominal pain.  Patient had been trying to treat it at home but it worsened this morning.  She also noted this morning she had a nosebleed and noted some clots.  She states her mother encouraged her to come in for further evaluation since she may be dehydrated and the patient was scared regarding the nosebleed.  Patient is currently not having any bleeding.  She's not on any antiplatelet or anticoagulation agents.  Patient is a 31 y.o. female presenting with headaches. The history is provided by the patient. No language interpreter was used.  Headache  This is a recurrent problem. The current episode started 2 days ago. The problem occurs constantly. The problem has been gradually worsening. The headache is associated with bright light. The quality of the pain is described as throbbing. The pain is severe. The pain does not radiate. Associated symptoms include nausea and vomiting. Pertinent negatives include no fever and no shortness of breath.    Past Medical History  Diagnosis Date  . Crohn disease   . Abdominal pain, unspecified site   . Hypertension   . Diabetes mellitus   . Pancreatitis   . Fibromyalgia   . Migraine   . CHF (congestive heart failure)   . Pulmonary embolism   . Chronic pain     Past Surgical History  Procedure Date  . Breast surgery   . Tonsillectomy     History reviewed. No pertinent family history.  History  Substance Use Topics    . Smoking status: Never Smoker   . Smokeless tobacco: Not on file  . Alcohol Use: No    OB History    Grav Para Term Preterm Abortions TAB SAB Ect Mult Living                  Review of Systems  Constitutional: Negative.  Negative for fever and chills.  HENT: Positive for nosebleeds.   Eyes: Negative.  Negative for discharge and redness.  Respiratory: Negative.  Negative for cough and shortness of breath.   Cardiovascular: Negative.  Negative for chest pain.  Gastrointestinal: Positive for nausea and vomiting. Negative for abdominal pain and diarrhea.  Genitourinary: Negative.  Negative for dysuria and vaginal discharge.  Musculoskeletal: Negative.  Negative for back pain.  Skin: Negative.  Negative for color change and rash.  Neurological: Positive for headaches. Negative for syncope.  Hematological: Negative.  Negative for adenopathy.  Psychiatric/Behavioral: Negative.  Negative for confusion.  All other systems reviewed and are negative.    Allergies  Caffeine; Compazine; Decadron; Flagyl; Reglan; and Zofran  Home Medications   Current Outpatient Rx  Name Route Sig Dispense Refill  . ACETAMINOPHEN 500 MG PO TABS Oral Take 1,000 mg by mouth every 6 (six) hours as needed. For pain    . CLONIDINE HCL 0.1 MG PO TABS Oral Take 0.1 mg by mouth daily as needed. For high blood pressure    . DIAZEPAM 5 MG PO  TABS Oral Take 5 mg by mouth 2 (two) times daily as needed. For anxiety or back pain    . FLUTICASONE PROPIONATE 50 MCG/ACT NA SUSP Nasal Place 2 sprays into the nose daily.     Marland Kitchen LORATADINE 10 MG PO TABS Oral Take 10 mg by mouth daily.      . NEBIVOLOL HCL 5 MG PO TABS Oral Take 5 mg by mouth daily.      Marland Kitchen OLMESARTAN-AMLODIPINE-HCTZ 40-5-25 MG PO TABS Oral Take 1 tablet by mouth daily.      . OXYCODONE-ACETAMINOPHEN 10-325 MG PO TABS Oral Take 1 tablet by mouth every 4 (four) hours as needed. For pain    . CHEWABLE MULTIPLE VITAMINS PO Oral Take 1 tablet by mouth daily.     Marland Kitchen PROMETHAZINE HCL 25 MG PO TABS Oral Take 1 tablet (25 mg total) by mouth every 6 (six) hours as needed for nausea. 10 tablet 0  . PROMETHAZINE HCL 25 MG PO TABS Oral Take 1 tablet (25 mg total) by mouth every 6 (six) hours as needed for nausea. 12 tablet 0  . VITAMIN D (ERGOCALCIFEROL) 50000 UNITS PO CAPS Oral Take 50,000 Units by mouth every 7 (seven) days. On Tuesday    . ZOLPIDEM TARTRATE 10 MG PO TABS Oral Take 10 mg by mouth at bedtime as needed. For sleep      BP 106/61  Pulse 96  Temp(Src) 98.2 F (36.8 C) (Oral)  Resp 20  Ht 5\' 6"  (1.676 m)  Wt 213 lb (96.616 kg)  BMI 34.38 kg/m2  SpO2 96%  LMP 03/02/2012  Physical Exam  Nursing note and vitals reviewed. Constitutional: She is oriented to person, place, and time. She appears well-developed and well-nourished.  Non-toxic appearance. She does not have a sickly appearance.  HENT:  Head: Normocephalic and atraumatic.  Eyes: Conjunctivae, EOM and lids are normal. Pupils are equal, round, and reactive to light. No scleral icterus.       Left nare laterally he has an area of dried blood that appears to be an abrasion that is the likely source of bleeding.  Neck: Trachea normal and normal range of motion. Neck supple.  Cardiovascular: Normal rate, regular rhythm and normal heart sounds.   Pulmonary/Chest: Effort normal and breath sounds normal. No respiratory distress. She has no wheezes. She has no rales.  Abdominal: Soft. Normal appearance. There is no tenderness. There is no rebound, no guarding and no CVA tenderness.  Musculoskeletal: Normal range of motion.  Neurological: She is alert and oriented to person, place, and time. She has normal strength.  Skin: Skin is warm, dry and intact. No rash noted.  Psychiatric: She has a normal mood and affect. Her behavior is normal. Judgment and thought content normal.    ED Course  Procedures (including critical care time)  Labs Reviewed - No data to display No results  found.   No diagnosis found.    MDM  Patient with her typical migraine for her which I will treat with Toradol, Phenergan and Benadryl as well as a small dose of fentanyl and check for improvement of her symptoms.  Patient had a nosebleed earlier which is resolved at this time.  She was given counseling regarding how to hold pressure and to now leave her nose alone. I've instructed her not to blow it or pick at it in any way.        Nat Christen, MD 03/30/12 1138  Patient with history of chronic headaches.  She reports that she has been on multiple medications including Topamax and Depakote and has allergies to other common headache medications including Compazine and steroids.  Patient has had improvement in her nausea here but states her headache is the same.  Patient does receive chronic pain medications from her primary care physician as discovered in the [place] controlled substance database.  Patient last received 9010 mg oxycodone/325 mg acetaminophen's on March 12.  She has since seen 2 other physicians and received Percocet and Norco prescriptions for 24 and 15 tabs respectively.  At this point in time as the patient's headaches are chronic in her primary care physician as planned followup for her with neurology feel she can be discharged home to followup with her primary care physician for continued chronic pain control.  Nat Christen, MD 03/30/12 1434

## 2012-04-27 ENCOUNTER — Emergency Department (HOSPITAL_BASED_OUTPATIENT_CLINIC_OR_DEPARTMENT_OTHER)
Admission: EM | Admit: 2012-04-27 | Discharge: 2012-04-27 | Disposition: A | Discharge status: Home / Self Care | Payer: Medicare Other | Attending: Emergency Medicine | Admitting: Emergency Medicine

## 2012-04-27 ENCOUNTER — Encounter (HOSPITAL_BASED_OUTPATIENT_CLINIC_OR_DEPARTMENT_OTHER): Payer: Self-pay

## 2012-04-27 DIAGNOSIS — R1110 Vomiting, unspecified: Secondary | ICD-10-CM

## 2012-04-27 DIAGNOSIS — G8929 Other chronic pain: Secondary | ICD-10-CM | POA: Insufficient documentation

## 2012-04-27 DIAGNOSIS — R1084 Generalized abdominal pain: Secondary | ICD-10-CM | POA: Insufficient documentation

## 2012-04-27 DIAGNOSIS — A499 Bacterial infection, unspecified: Secondary | ICD-10-CM | POA: Insufficient documentation

## 2012-04-27 DIAGNOSIS — B3731 Acute candidiasis of vulva and vagina: Secondary | ICD-10-CM | POA: Insufficient documentation

## 2012-04-27 DIAGNOSIS — Z86718 Personal history of other venous thrombosis and embolism: Secondary | ICD-10-CM | POA: Insufficient documentation

## 2012-04-27 DIAGNOSIS — I10 Essential (primary) hypertension: Secondary | ICD-10-CM | POA: Insufficient documentation

## 2012-04-27 DIAGNOSIS — K5090 Crohn's disease, unspecified, without complications: Secondary | ICD-10-CM | POA: Insufficient documentation

## 2012-04-27 DIAGNOSIS — B9689 Other specified bacterial agents as the cause of diseases classified elsewhere: Secondary | ICD-10-CM | POA: Insufficient documentation

## 2012-04-27 DIAGNOSIS — Z79899 Other long term (current) drug therapy: Secondary | ICD-10-CM | POA: Insufficient documentation

## 2012-04-27 DIAGNOSIS — E119 Type 2 diabetes mellitus without complications: Secondary | ICD-10-CM | POA: Insufficient documentation

## 2012-04-27 DIAGNOSIS — I509 Heart failure, unspecified: Secondary | ICD-10-CM | POA: Insufficient documentation

## 2012-04-27 DIAGNOSIS — N760 Acute vaginitis: Secondary | ICD-10-CM | POA: Insufficient documentation

## 2012-04-27 DIAGNOSIS — B373 Candidiasis of vulva and vagina: Secondary | ICD-10-CM | POA: Insufficient documentation

## 2012-04-27 DIAGNOSIS — R112 Nausea with vomiting, unspecified: Secondary | ICD-10-CM | POA: Insufficient documentation

## 2012-04-27 HISTORY — DX: Polycystic ovarian syndrome: E28.2

## 2012-04-27 LAB — URINALYSIS, ROUTINE W REFLEX MICROSCOPIC
Glucose, UA: NEGATIVE mg/dL
Ketones, ur: NEGATIVE mg/dL
Leukocytes, UA: NEGATIVE
Nitrite: NEGATIVE
Protein, ur: NEGATIVE mg/dL
Urobilinogen, UA: 0.2 mg/dL (ref 0.0–1.0)

## 2012-04-27 LAB — CBC
HCT: 32.6 % — ABNORMAL LOW (ref 36.0–46.0)
Hemoglobin: 11.4 g/dL — ABNORMAL LOW (ref 12.0–15.0)
MCH: 26.3 pg (ref 26.0–34.0)
MCHC: 35 g/dL (ref 30.0–36.0)
RBC: 4.34 MIL/uL (ref 3.87–5.11)

## 2012-04-27 LAB — DIFFERENTIAL
Basophils Relative: 0 % (ref 0–1)
Lymphs Abs: 3.8 10*3/uL (ref 0.7–4.0)
Monocytes Absolute: 1.1 10*3/uL — ABNORMAL HIGH (ref 0.1–1.0)
Monocytes Relative: 9 % (ref 3–12)
Neutro Abs: 7.8 10*3/uL — ABNORMAL HIGH (ref 1.7–7.7)

## 2012-04-27 LAB — COMPREHENSIVE METABOLIC PANEL
ALT: 17 U/L (ref 0–35)
AST: 19 U/L (ref 0–37)
Alkaline Phosphatase: 94 U/L (ref 39–117)
CO2: 24 mEq/L (ref 19–32)
Chloride: 99 mEq/L (ref 96–112)
GFR calc Af Amer: >90 mL/min (ref >90)
GFR calc non Af Amer: >90 mL/min (ref >90)
Glucose, Bld: 109 mg/dL — ABNORMAL HIGH (ref 70–99)
Potassium: 3.8 mEq/L (ref 3.5–5.1)
Sodium: 135 mEq/L (ref 135–145)

## 2012-04-27 LAB — WET PREP, GENITAL

## 2012-04-27 LAB — PREGNANCY, URINE: Preg Test, Ur: NEGATIVE

## 2012-04-27 MED ORDER — MORPHINE SULFATE 4 MG/ML IJ SOLN
INTRAMUSCULAR | Status: AC
  Filled 2012-04-27: qty 1

## 2012-04-27 MED ORDER — PROMETHAZINE HCL 25 MG PO TABS: 25.0000 mg | ORAL_TABLET | Freq: Four times a day (QID) | ORAL | Status: DC | PRN

## 2012-04-27 MED ORDER — MORPHINE SULFATE 4 MG/ML IJ SOLN
4.0000 mg | Freq: Once | INTRAMUSCULAR | Status: AC
  Administered 2012-04-27: 4 mg via INTRAVENOUS

## 2012-04-27 MED ORDER — PROMETHAZINE HCL 25 MG/ML IJ SOLN
12.5000 mg | Freq: Once | INTRAMUSCULAR | Status: AC
  Administered 2012-04-27: 25 mg via INTRAVENOUS
  Filled 2012-04-27: qty 1

## 2012-04-27 MED ORDER — FLUCONAZOLE 150 MG PO TABS: 150.0000 mg | ORAL_TABLET | Freq: Every day | ORAL | Status: AC

## 2012-04-27 MED ORDER — SODIUM CHLORIDE 0.9 % IV BOLUS (SEPSIS)
1000.0000 mL | Freq: Once | INTRAVENOUS | Status: AC
  Administered 2012-04-27: 1000 mL via INTRAVENOUS

## 2012-04-27 NOTE — ED Provider Notes (Signed)
History     CSN: [id number]  Arrival date & time 04/27/12  1512   First MD Initiated Contact with Patient 04/27/12 1526      Chief Complaint  Patient presents with  . Abdominal Pain    (Consider location/radiation/quality/duration/timing/severity/associated sxs/prior treatment) HPI Comments: Pt states that she has a history of chronic abdominal pain:pt states that she is having generalized abdominal pain:pt states that he is on clinda for a bacterial infection:pt denies fever  Patient is a 30 y.o. female presenting with abdominal pain. The history is provided by the patient.  Abdominal Pain The primary symptoms of the illness include abdominal pain, nausea, vomiting and vaginal discharge. The primary symptoms of the illness do not include fever or diarrhea. The current episode started more than 2 days ago. The onset of the illness was gradual. The problem has not changed since onset. The patient states that she believes she is currently not pregnant. The patient has not had a change in bowel habit. Symptoms associated with the illness do not include frequency or back pain.    Past Medical History  Diagnosis Date  . Crohn disease   . Abdominal pain, unspecified site   . Hypertension   . Diabetes mellitus   . Pancreatitis   . Fibromyalgia   . Migraine   . CHF (congestive heart failure)   . Pulmonary embolism   . Chronic pain   . Polycystic ovarian disease     Past Surgical History  Procedure Date  . Breast surgery   . Tonsillectomy     No family history on file.  History  Substance Use Topics  . Smoking status: Never Smoker   . Smokeless tobacco: Not on file  . Alcohol Use: No    OB History    Grav Para Term Preterm Abortions TAB SAB Ect Mult Living                  Review of Systems  Constitutional: Negative for fever.  Respiratory: Negative.   Cardiovascular: Negative.   Gastrointestinal: Positive for nausea, vomiting and abdominal pain. Negative for  diarrhea.  Genitourinary: Positive for vaginal discharge. Negative for frequency.  Musculoskeletal: Negative for back pain.    Allergies  Caffeine; Compazine; Decadron; Flagyl; Metoclopramide hcl; and Zofran  Home Medications   Current Outpatient Rx  Name Route Sig Dispense Refill  . ACETAMINOPHEN 500 MG PO TABS Oral Take 1,000 mg by mouth every 6 (six) hours as needed. For pain    . CLONIDINE HCL 0.1 MG PO TABS Oral Take 0.1 mg by mouth daily as needed. For high blood pressure    . DIAZEPAM 5 MG PO TABS Oral Take 5 mg by mouth 2 (two) times daily as needed. For anxiety or back pain    . FLUTICASONE PROPIONATE 50 MCG/ACT NA SUSP Nasal Place 2 sprays into the nose daily.     Marland Kitchen LORATADINE 10 MG PO TABS Oral Take 10 mg by mouth daily.      . NEBIVOLOL HCL 5 MG PO TABS Oral Take 5 mg by mouth daily.      Marland Kitchen OLMESARTAN-AMLODIPINE-HCTZ 40-5-25 MG PO TABS Oral Take 1 tablet by mouth daily.      . OXYCODONE-ACETAMINOPHEN 10-325 MG PO TABS Oral Take 1 tablet by mouth every 4 (four) hours as needed. For pain    . CHEWABLE MULTIPLE VITAMINS PO Oral Take 1 tablet by mouth daily.    Marland Kitchen PROMETHAZINE HCL 25 MG PO TABS Oral Take 1  tablet (25 mg total) by mouth every 6 (six) hours as needed for nausea. 10 tablet 0  . PROMETHAZINE HCL 25 MG PO TABS Oral Take 1 tablet (25 mg total) by mouth every 6 (six) hours as needed for nausea. 12 tablet 0  . VITAMIN D (ERGOCALCIFEROL) 50000 UNITS PO CAPS Oral Take 50,000 Units by mouth every 7 (seven) days. On Tuesday    . ZOLPIDEM TARTRATE 10 MG PO TABS Oral Take 10 mg by mouth at bedtime as needed. For sleep      BP 136/93  Pulse 107  Temp(Src) 98.3 F (36.8 C) (Oral)  Resp 20  Ht 5\' 6"  (1.676 m)  Wt 224 lb (101.606 kg)  BMI 36.15 kg/m2  SpO2 98%  LMP 04/01/2012  Physical Exam  Nursing note and vitals reviewed. Constitutional: She is oriented to person, place, and time. She appears well-developed and well-nourished.  HENT:  Head: Normocephalic and  atraumatic.  Eyes: Conjunctivae and EOM are normal.  Neck: Neck supple.  Cardiovascular: Normal rate and regular rhythm.   Pulmonary/Chest: Effort normal and breath sounds normal.  Abdominal: Soft. Bowel sounds are normal.       Pt has generalized tenderness  Musculoskeletal: Normal range of motion.  Neurological: She is alert and oriented to person, place, and time.  Skin: Skin is warm and dry.  Psychiatric: She has a normal mood and affect.    ED Course  Procedures (including critical care time)  Labs Reviewed  WET PREP, GENITAL - Abnormal; Notable for the following:    Yeast Wet Prep HPF POC MODERATE (*)    Clue Cells Wet Prep HPF POC MANY (*)    WBC, Wet Prep HPF POC RARE (*)    All other components within normal limits  COMPREHENSIVE METABOLIC PANEL - Abnormal; Notable for the following:    Glucose, Bld 109 (*)    All other components within normal limits  CBC - Abnormal; Notable for the following:    WBC 13.0 (*)    Hemoglobin 11.4 (*)    HCT 32.6 (*)    MCV 75.1 (*)    RDW 15.8 (*)    All other components within normal limits  DIFFERENTIAL - Abnormal; Notable for the following:    Neutro Abs 7.8 (*)    Monocytes Absolute 1.1 (*)    All other components within normal limits  URINALYSIS, ROUTINE W REFLEX MICROSCOPIC  PREGNANCY, URINE  LIPASE, BLOOD  GC/CHLAMYDIA PROBE AMP, GENITAL   No results found.   1. BV (bacterial vaginosis)   2. Candidal vaginitis   3. Abdominal pain   4. Vomiting       MDM  Pt is feeling better at this time and tolerating po at this time:pt is okay to follow up for continued symptoms:will treat for yeast:pt is already being treated for bv:don't think imaging is needed at this time        Teressa Lower, NP 04/27/12 1711

## 2012-04-27 NOTE — Discharge Instructions (Signed)
Abdominal Pain Abdominal pain can be caused by many things. Your caregiver decides the seriousness of your pain by an examination and possibly blood tests and X-rays. Many cases can be observed and treated at home. Most abdominal pain is not caused by a disease and will probably improve without treatment. However, in many cases, more time must pass before a clear cause of the pain can be found. Before that point, it may not be known if you need more testing, or if hospitalization or surgery is needed. HOME CARE INSTRUCTIONS   Do not take laxatives unless directed by your caregiver.   Take pain medicine only as directed by your caregiver.   Only take over-the-counter or prescription medicines for pain, discomfort, or fever as directed by your caregiver.   Try a clear liquid diet (broth, tea, or water) for as long as directed by your caregiver. Slowly move to a bland diet as tolerated.  SEEK IMMEDIATE MEDICAL CARE IF:   The pain does not go away.   You have a fever.   You keep throwing up (vomiting).   The pain is felt only in portions of the abdomen. Pain in the right side could possibly be appendicitis. In an adult, pain in the left lower portion of the abdomen could be colitis or diverticulitis.   You pass bloody or black tarry stools.  MAKE SURE YOU:   Understand these instructions.   Will watch your condition.   Will get help right away if you are not doing well or get worse.  Document Released: 09/16/2005 Document Revised: 11/26/2011 Document Reviewed: 07/25/2008 [facility] Patient Information 2012 [place], [place].Bacterial Vaginosis Bacterial vaginosis is an infection of the vagina. A healthy vagina has many kinds of good germs (bacteria). Sometimes the number of good germs can change. This allows bad germs to move in and cause an infection. You may be given medicine (antibiotics) to treat the infection. Or, you may not need treatment at all. HOME CARE  Take your medicine as  told. Finish them even if you start to feel better.   Do not have sex until you finish your medicine.   Do not douche.   Practice safe sex.   Tell your sex partner that you have an infection. They should see their doctor for treatment if they have problems.  GET HELP RIGHT AWAY IF:  You do not get better after 3 days of treatment.   You have grey fluid (discharge) coming from your vagina.   You have pain.   You have a temperature of 102 F (38.9 C) or higher.  MAKE SURE YOU:   Understand these instructions.   Will watch your condition.   Will get help right away if you are not doing well or get worse.  Document Released: 09/15/2008 Document Revised: 11/26/2011 Document Reviewed: 09/15/2008 [facility] Patient Information 2012 [place], [place].Candida Infection, Adult A candida infection (also called yeast, fungus and Monilia infection) is an overgrowth of yeast that can occur anywhere on the body. A yeast infection commonly occurs in warm, moist body areas. Usually, the infection remains localized but can spread to become a systemic infection. A yeast infection may be a sign of a more severe disease such as diabetes, leukemia, or AIDS. A yeast infection can occur in both men and women. In women, Candida vaginitis is a vaginal infection. It is one of the most common causes of vaginitis. Men usually do not have symptoms or know they have an infection until other problems develop. Men  may find out they have a yeast infection because their sex partner has a yeast infection. Uncircumcised men are more likely to get a yeast infection than circumcised men. This is because the uncircumcised glans is not exposed to air and does not remain as dry as that of a circumcised glans. Older adults may develop yeast infections around dentures. CAUSES  Women  Antibiotics.   Steroid medication taken for a long time.   Being overweight (obese).   Diabetes.   Poor immune condition.   Certain serious  medical conditions.   Immune suppressive medications for organ transplant patients.   Chemotherapy.   Pregnancy.   Menstration.   Stress and fatigue.   Intravenous drug use.   Oral contraceptives.   Wearing tight-fitting clothes in the crotch area.   Catching it from a sex partner who has a yeast infection.   Spermicide.   Intravenous, urinary, or other catheters.  Men  Catching it from a sex partner who has a yeast infection.   Having oral or anal sex with a person who has the infection.   Spermicide.   Diabetes.   Antibiotics.   Poor immune system.   Medications that suppress the immune system.   Intravenous drug use.   Intravenous, urinary, or other catheters.  SYMPTOMS  Women  Thick, white vaginal discharge.   Vaginal itching.   Redness and swelling in and around the vagina.   Irritation of the lips of the vagina and perineum.   Blisters on the vaginal lips and perineum.   Painful sexual intercourse.   Low blood sugar (hypoglycemia).   Painful urination.   Bladder infections.   Intestinal problems such as constipation, indigestion, bad breath, bloating, increase in gas, diarrhea, or loose stools.  Men  Men may develop intestinal problems such as constipation, indigestion, bad breath, bloating, increase in gas, diarrhea, or loose stools.   Dry, cracked skin on the penis with itching or discomfort.   Jock itch.   Dry, flaky skin.   Athlete's foot.   Hypoglycemia.  DIAGNOSIS  Women  A history and an exam are performed.   The discharge may be examined under a microscope.   A culture may be taken of the discharge.  Men  A history and an exam are performed.   Any discharge from the penis or areas of cracked skin will be looked at under the microscope and cultured.   Stool samples may be cultured.  TREATMENT  Women  Vaginal antifungal suppositories and creams.   Medicated creams to decrease irritation and itching on the  outside of the vagina.   Warm compresses to the perineal area to decrease swelling and discomfort.   Oral antifungal medications.   Medicated vaginal suppositories or cream for repeated or recurrent infections.   Wash and dry the irritation areas before applying the cream.   Eating yogurt with lactobacillus may help with prevention and treatment.   Sometimes painting the vagina with gentian violet solution may help if creams and suppositories do not work.  Men  Antifungal creams and oral antifungal medications.   Sometimes treatment must continue for 30 days after the symptoms go away to prevent recurrence.  HOME CARE INSTRUCTIONS  Women  Use cotton underwear and avoid tight-fitting clothing.   Avoid colored, scented toilet paper and deodorant tampons or pads.   Do not douche.   Keep your diabetes under control.   Finish all the prescribed medications.   Keep your skin clean and dry.  Consume milk or yogurt with lactobacillus active culture regularly. If you get frequent yeast infections and think that is what the infection is, there are over-the-counter medications that you can get. If the infection does not show healing in 3 days, talk to your caregiver.   Tell your sex partner you have a yeast infection. Your partner may need treatment also, especially if your infection does not clear up or recurs.  Men  Keep your skin clean and dry.   Keep your diabetes under control.   Finish all prescribed medications.   Tell your sex partner that you have a yeast infection so they can be treated if necessary.  SEEK MEDICAL CARE IF:   Your symptoms do not clear up or worsen in one week after treatment.   You have an oral temperature above 102 F (38.9 C).   You have trouble swallowing or eating for a prolonged time.   You develop blisters on and around your vagina.   You develop vaginal bleeding and it is not your menstrual period.   You develop abdominal pain.   You  develop intestinal problems as mentioned above.   You get weak or lightheaded.   You have painful or increased urination.   You have pain during sexual intercourse.  MAKE SURE YOU:   Understand these instructions.   Will watch your condition.   Will get help right away if you are not doing well or get worse.  Document Released: 01/14/2005 Document Revised: 11/26/2011 Document Reviewed: 04/28/2010 [facility] Patient Information 2012 [place], [place].

## 2012-04-27 NOTE — ED Provider Notes (Signed)
Medical screening examination/treatment/procedure(s) were performed by non-physician practitioner and as supervising physician I was immediately available for consultation/collaboration.   Mal Asher, MD 04/27/12 1751 

## 2012-04-27 NOTE — ED Notes (Signed)
abd and pelvic pain, vomiting, light vaginal d/c x 5 days-was seen by GYN last week-was started on clindamycin "for the bacteria"-states pain is worse with vomiting

## 2012-07-01 ENCOUNTER — Emergency Department (HOSPITAL_BASED_OUTPATIENT_CLINIC_OR_DEPARTMENT_OTHER)
Admission: EM | Admit: 2012-07-01 | Discharge: 2012-07-01 | Disposition: A | Discharge status: Home / Self Care | Payer: Medicare Other | Attending: Emergency Medicine | Admitting: Emergency Medicine

## 2012-07-01 ENCOUNTER — Encounter (HOSPITAL_BASED_OUTPATIENT_CLINIC_OR_DEPARTMENT_OTHER): Payer: Self-pay | Admitting: Emergency Medicine

## 2012-07-01 DIAGNOSIS — R109 Unspecified abdominal pain: Secondary | ICD-10-CM | POA: Insufficient documentation

## 2012-07-01 DIAGNOSIS — M549 Dorsalgia, unspecified: Secondary | ICD-10-CM | POA: Insufficient documentation

## 2012-07-01 DIAGNOSIS — G43909 Migraine, unspecified, not intractable, without status migrainosus: Secondary | ICD-10-CM | POA: Insufficient documentation

## 2012-07-01 DIAGNOSIS — I509 Heart failure, unspecified: Secondary | ICD-10-CM | POA: Insufficient documentation

## 2012-07-01 DIAGNOSIS — E119 Type 2 diabetes mellitus without complications: Secondary | ICD-10-CM | POA: Insufficient documentation

## 2012-07-01 DIAGNOSIS — R1110 Vomiting, unspecified: Secondary | ICD-10-CM | POA: Insufficient documentation

## 2012-07-01 DIAGNOSIS — Z86711 Personal history of pulmonary embolism: Secondary | ICD-10-CM | POA: Insufficient documentation

## 2012-07-01 DIAGNOSIS — I10 Essential (primary) hypertension: Secondary | ICD-10-CM | POA: Insufficient documentation

## 2012-07-01 DIAGNOSIS — IMO0001 Reserved for inherently not codable concepts without codable children: Secondary | ICD-10-CM | POA: Insufficient documentation

## 2012-07-01 LAB — COMPREHENSIVE METABOLIC PANEL
AST: 24 U/L (ref 0–37)
BUN: 5 mg/dL — ABNORMAL LOW (ref 6–23)
CO2: 25 mEq/L (ref 19–32)
Calcium: 9.8 mg/dL (ref 8.4–10.5)
Chloride: 99 mEq/L (ref 96–112)
Creatinine, Ser: 0.7 mg/dL (ref 0.50–1.10)
GFR calc Af Amer: >90 mL/min (ref >90)
GFR calc non Af Amer: >90 mL/min (ref >90)
Glucose, Bld: 118 mg/dL — ABNORMAL HIGH (ref 70–99)
Total Bilirubin: 0.1 mg/dL — ABNORMAL LOW (ref 0.3–1.2)

## 2012-07-01 LAB — CBC WITH DIFFERENTIAL/PLATELET
Eosinophils Relative: 2 % (ref 0–5)
HCT: 32.2 % — ABNORMAL LOW (ref 36.0–46.0)
Hemoglobin: 10.8 g/dL — ABNORMAL LOW (ref 12.0–15.0)
Lymphocytes Relative: 37 % (ref 12–46)
Lymphs Abs: 3.5 10*3/uL (ref 0.7–4.0)
MCV: 76.5 fL — ABNORMAL LOW (ref 78.0–100.0)
Monocytes Absolute: 0.8 10*3/uL (ref 0.1–1.0)
Monocytes Relative: 9 % (ref 3–12)
Neutro Abs: 5 10*3/uL (ref 1.7–7.7)
RBC: 4.21 MIL/uL (ref 3.87–5.11)
RDW: 16.2 % — ABNORMAL HIGH (ref 11.5–15.5)
WBC: 9.5 10*3/uL (ref 4.0–10.5)

## 2012-07-01 LAB — URINALYSIS, ROUTINE W REFLEX MICROSCOPIC
Bilirubin Urine: NEGATIVE
Glucose, UA: NEGATIVE mg/dL
Hgb urine dipstick: NEGATIVE
Specific Gravity, Urine: 1.005 (ref 1.005–1.030)
Urobilinogen, UA: 0.2 mg/dL (ref 0.0–1.0)

## 2012-07-01 LAB — PREGNANCY, URINE: Preg Test, Ur: NEGATIVE

## 2012-07-01 MED ORDER — HYDROMORPHONE HCL PF 1 MG/ML IJ SOLN
1.0000 mg | Freq: Once | INTRAMUSCULAR | Status: AC
  Administered 2012-07-01: 1 mg via INTRAVENOUS
  Filled 2012-07-01: qty 1

## 2012-07-01 MED ORDER — PROMETHAZINE HCL 25 MG/ML IJ SOLN
25.0000 mg | Freq: Once | INTRAMUSCULAR | Status: AC
  Administered 2012-07-01: 25 mg via INTRAVENOUS
  Filled 2012-07-01: qty 1

## 2012-07-01 MED ORDER — SODIUM CHLORIDE 0.9 % IV BOLUS (SEPSIS)
1000.0000 mL | Freq: Once | INTRAVENOUS | Status: AC
  Administered 2012-07-01: 1000 mL via INTRAVENOUS

## 2012-07-01 NOTE — ED Notes (Signed)
Pt began vomiting again after chewing ice chips. EDP at bedside.

## 2012-07-01 NOTE — ED Notes (Signed)
DR Bernette Mayers at bedside.

## 2012-07-01 NOTE — ED Notes (Signed)
While in the bathroom obtaining urine sample, pt asking for wash cloth because she urinated on herself. Pt escorted to treatment room. No visible urine on pt clothing.

## 2012-07-01 NOTE — ED Provider Notes (Signed)
History     CSN: [id number]  Arrival date & time 07/01/12  0129   First MD Initiated Contact with Patient 07/01/12 0222      Chief Complaint  Patient presents with  . Abdominal Pain  . Vomiting  . Back Pain    (Consider location/radiation/quality/duration/timing/severity/associated sxs/prior treatment) HPI Pt with reported history of crohn's disease and chronic abdominal pain reports she has had a week of back pain, abdominal pain and persistent vomiting. States has not been able to keep down any food or fluids for the last week. She was seen by her PCP 2 days ago and referred to GI. She takes oxycodone at home but states she has not had any in a week.   Past Medical History  Diagnosis Date  . Crohn disease   . Abdominal pain, unspecified site   . Hypertension   . Diabetes mellitus   . Pancreatitis   . Fibromyalgia   . Migraine   . CHF (congestive heart failure)   . Pulmonary embolism   . Chronic pain   . Polycystic ovarian disease     Past Surgical History  Procedure Date  . Breast surgery   . Tonsillectomy     No family history on file.  History  Substance Use Topics  . Smoking status: Never Smoker   . Smokeless tobacco: Not on file  . Alcohol Use: No    OB History    Grav Para Term Preterm Abortions TAB SAB Ect Mult Living                  Review of Systems All other systems reviewed and are negative except as noted in HPI.   Allergies  Caffeine; Compazine; Decadron; Flagyl; Metoclopramide hcl; and Zofran  Home Medications   Current Outpatient Rx  Name Route Sig Dispense Refill  . ACETAMINOPHEN 500 MG PO TABS Oral Take 1,000 mg by mouth every 6 (six) hours as needed. For pain    . CARVEDILOL 25 MG PO TABS Oral Take 25 mg by mouth 2 (two) times daily with a meal.    . CLONIDINE HCL 0.1 MG PO TABS Oral Take 0.1 mg by mouth daily as needed. For high blood pressure    . DIAZEPAM 5 MG PO TABS Oral Take 5 mg by mouth 2 (two) times daily as needed. For  anxiety or back pain    . FLUTICASONE PROPIONATE 50 MCG/ACT NA SUSP Nasal Place 2 sprays into the nose daily.     Marland Kitchen LORATADINE 10 MG PO TABS Oral Take 10 mg by mouth daily.      Marland Kitchen OLMESARTAN-AMLODIPINE-HCTZ 40-5-25 MG PO TABS Oral Take 1 tablet by mouth daily.      . CHEWABLE MULTIPLE VITAMINS PO Oral Take 1 tablet by mouth daily.    Marland Kitchen PROMETHAZINE HCL 25 MG PO TABS Oral Take 1 tablet (25 mg total) by mouth every 6 (six) hours as needed for nausea. 10 tablet 0  . PROMETHAZINE HCL 25 MG PO TABS Oral Take 1 tablet (25 mg total) by mouth every 6 (six) hours as needed for nausea. 12 tablet 0  . PROMETHAZINE HCL 25 MG PO TABS Oral Take 1 tablet (25 mg total) by mouth every 6 (six) hours as needed for nausea. 15 tablet 0  . VITAMIN D (ERGOCALCIFEROL) 50000 UNITS PO CAPS Oral Take 50,000 Units by mouth every 7 (seven) days. On Tuesday    . ZOLPIDEM TARTRATE 10 MG PO TABS Oral Take 10 mg by  mouth at bedtime as needed. For sleep      BP 127/84  Pulse 87  Temp 98 F (36.7 C) (Oral)  Resp 18  SpO2 96%  Physical Exam  Nursing note and vitals reviewed. Constitutional: She is oriented to person, place, and time. She appears well-developed and well-nourished.  HENT:  Head: Normocephalic and atraumatic.  Eyes: EOM are normal. Pupils are equal, round, and reactive to light.  Neck: Normal range of motion. Neck supple.  Cardiovascular: Normal rate, normal heart sounds and intact distal pulses.   Pulmonary/Chest: Effort normal and breath sounds normal.  Abdominal: Bowel sounds are normal. She exhibits no distension. There is tenderness (diffuse tenderness no peritoneal signs). There is no rebound and no guarding.  Musculoskeletal: Normal range of motion. She exhibits tenderness (paraspinal muscle tenderness). She exhibits no edema.  Neurological: She is alert and oriented to person, place, and time. She has normal strength. No cranial nerve deficit or sensory deficit.  Skin: Skin is warm and dry. No rash  noted.  Psychiatric: She has a normal mood and affect.    ED Course  Procedures (including critical care time)  Labs Reviewed  URINALYSIS, ROUTINE W REFLEX MICROSCOPIC - Abnormal; Notable for the following:    APPearance CLOUDY (*)     All other components within normal limits  CBC WITH DIFFERENTIAL - Abnormal; Notable for the following:    Hemoglobin 10.8 (*)     HCT 32.2 (*)     MCV 76.5 (*)     MCH 25.7 (*)     RDW 16.2 (*)     All other components within normal limits  COMPREHENSIVE METABOLIC PANEL - Abnormal; Notable for the following:    Glucose, Bld 118 (*)     BUN 5 (*)     Total Bilirubin 0.1 (*)     All other components within normal limits  PREGNANCY, URINE  LIPASE, BLOOD   No results found.   No diagnosis found.    MDM  Labs as above unremarkable. She has a normal UA, unconcentrated despite her assertion that she has not kept down any fluids in a week. She was given pain and nausea meds in the ED and then ice chips. She states she is vomiting the ice chips, but appears that she is spitting out saliva and gagging but no true vomiting. Advised to followup with PCP. She has been getting regular monthly Oxycodone Rx from her PCP but did not initially report this.         Charles B. Bernette Mayers, MD 07/01/12 [phone]

## 2012-07-01 NOTE — ED Notes (Signed)
Pt reports vomiting, abd pain and back pain x 1 week. Pt states she was seen at [facility] x 2 days ago. Pt appears to be under the influence of some medication. Pt is slow to answer questions and drowsy in triage. Pt denies taking any other medications other than tylenol

## 2012-07-01 NOTE — ED Notes (Signed)
Pt reports diffuse abdominal pains x1 week. Saw her [facility] two days ago and states they were unable to draw blood "because i was so dehydrated". Pt states they gave her phenergan at the office and sent her home, but her symptoms continue. Pt here tonight for continued pain and n/v.

## 2012-07-09 ENCOUNTER — Emergency Department (HOSPITAL_BASED_OUTPATIENT_CLINIC_OR_DEPARTMENT_OTHER)
Admission: EM | Admit: 2012-07-09 | Discharge: 2012-07-09 | Disposition: A | Discharge status: Home / Self Care | Payer: Medicare Other | Attending: Emergency Medicine | Admitting: Emergency Medicine

## 2012-07-09 ENCOUNTER — Encounter (HOSPITAL_BASED_OUTPATIENT_CLINIC_OR_DEPARTMENT_OTHER): Payer: Self-pay | Admitting: Emergency Medicine

## 2012-07-09 DIAGNOSIS — R1013 Epigastric pain: Secondary | ICD-10-CM | POA: Insufficient documentation

## 2012-07-09 DIAGNOSIS — IMO0001 Reserved for inherently not codable concepts without codable children: Secondary | ICD-10-CM | POA: Insufficient documentation

## 2012-07-09 DIAGNOSIS — Z86711 Personal history of pulmonary embolism: Secondary | ICD-10-CM | POA: Insufficient documentation

## 2012-07-09 DIAGNOSIS — I509 Heart failure, unspecified: Secondary | ICD-10-CM | POA: Insufficient documentation

## 2012-07-09 DIAGNOSIS — K5090 Crohn's disease, unspecified, without complications: Secondary | ICD-10-CM | POA: Insufficient documentation

## 2012-07-09 DIAGNOSIS — R109 Unspecified abdominal pain: Secondary | ICD-10-CM

## 2012-07-09 DIAGNOSIS — E119 Type 2 diabetes mellitus without complications: Secondary | ICD-10-CM | POA: Insufficient documentation

## 2012-07-09 DIAGNOSIS — I10 Essential (primary) hypertension: Secondary | ICD-10-CM | POA: Insufficient documentation

## 2012-07-09 LAB — CBC WITH DIFFERENTIAL/PLATELET
Basophils Absolute: 0 10*3/uL (ref 0.0–0.1)
Basophils Relative: 0 % (ref 0–1)
Eosinophils Absolute: 0.2 10*3/uL (ref 0.0–0.7)
HCT: 32.5 % — ABNORMAL LOW (ref 36.0–46.0)
Hemoglobin: 11 g/dL — ABNORMAL LOW (ref 12.0–15.0)
MCH: 25.6 pg — ABNORMAL LOW (ref 26.0–34.0)
MCHC: 33.8 g/dL (ref 30.0–36.0)
Monocytes Absolute: 1.1 10*3/uL — ABNORMAL HIGH (ref 0.1–1.0)
Monocytes Relative: 10 % (ref 3–12)
Neutrophils Relative %: 57 % (ref 43–77)
RDW: 16.5 % — ABNORMAL HIGH (ref 11.5–15.5)

## 2012-07-09 LAB — COMPREHENSIVE METABOLIC PANEL
AST: 18 U/L (ref 0–37)
Albumin: 3.9 g/dL (ref 3.5–5.2)
BUN: 7 mg/dL (ref 6–23)
Calcium: 10 mg/dL (ref 8.4–10.5)
Creatinine, Ser: 0.9 mg/dL (ref 0.50–1.10)
Total Protein: 8 g/dL (ref 6.0–8.3)

## 2012-07-09 LAB — PREGNANCY, URINE: Preg Test, Ur: NEGATIVE

## 2012-07-09 LAB — URINE MICROSCOPIC-ADD ON

## 2012-07-09 LAB — URINALYSIS, ROUTINE W REFLEX MICROSCOPIC
Glucose, UA: NEGATIVE mg/dL
Ketones, ur: NEGATIVE mg/dL
Protein, ur: NEGATIVE mg/dL
Urobilinogen, UA: 0.2 mg/dL (ref 0.0–1.0)

## 2012-07-09 LAB — LIPASE, BLOOD: Lipase: 30 U/L (ref 11–59)

## 2012-07-09 MED ORDER — PROMETHAZINE HCL 25 MG/ML IJ SOLN
12.5000 mg | Freq: Once | INTRAMUSCULAR | Status: AC
  Administered 2012-07-09: 12.5 mg via INTRAVENOUS
  Filled 2012-07-09: qty 1

## 2012-07-09 MED ORDER — PROMETHAZINE HCL 25 MG PO TABS: 25.0000 mg | ORAL_TABLET | Freq: Four times a day (QID) | ORAL | Status: DC | PRN

## 2012-07-09 MED ORDER — HYDROMORPHONE HCL PF 1 MG/ML IJ SOLN
1.0000 mg | Freq: Once | INTRAMUSCULAR | Status: AC
  Administered 2012-07-09: 1 mg via INTRAVENOUS
  Filled 2012-07-09: qty 1

## 2012-07-09 MED ORDER — SODIUM CHLORIDE 0.9 % IV BOLUS (SEPSIS)
1000.0000 mL | Freq: Once | INTRAVENOUS | Status: AC
  Administered 2012-07-09: 1000 mL via INTRAVENOUS

## 2012-07-09 NOTE — ED Notes (Signed)
Pt c/o persistent NV x 2 weeks- was admitted [facility] last week for same; no improvement

## 2012-07-09 NOTE — ED Notes (Signed)
Pt. Talking with no s/s of nausea and no vomiting.  Pt. HOB elevated.

## 2012-07-09 NOTE — ED Notes (Signed)
Informed Pt. That I have made EDP aware of Pt. Complaints and Pt. At present time is up to restroom.

## 2012-07-09 NOTE — ED Notes (Signed)
Pt. Reports she is having vomiting at this time.  Pt. Is spitting when RN enters room.

## 2012-07-09 NOTE — ED Provider Notes (Signed)
History   This chart was scribed for Rolan Bucco, MD scribed by Magnus Sinning. The patient was seen in room MH01/MH01 seen at 19:58.    CSN: [id number]  Arrival date & time 07/09/12  1814   First MD Initiated Contact with Patient 07/09/12 1951      Chief Complaint  Patient presents with  . Emesis    (Consider location/radiation/quality/duration/timing/severity/associated sxs/prior treatment) HPI Kristi Simpson is a 31 y.o. female who presents to the Emergency Department complaining of constant moderate sharp abd pain, with associated emesis and nausea onset two weeks. Patient states she has hx of recurrent abd pain and says she was recent hospitalized and then released three days ago.  Patient states she's been unable to keep anything down and that she has only taken tylenol since she was discharged from the hospital. Patient was previously taking oxycodone for herniated disk and migraines and says she currently takes carvedilol. Denies CP, trouble breathing, diarrhea, urinary sxs , or fevers. Reports hx of pancreatis that occurred years ago, ovarian cyst, and CHF.  Gastroenterologist: Dr. Vernell Barrier at [facility] Past Medical History  Diagnosis Date  . Crohn disease   . Abdominal pain, unspecified site   . Hypertension   . Diabetes mellitus   . Pancreatitis   . Fibromyalgia   . Migraine   . CHF (congestive heart failure)   . Pulmonary embolism   . Chronic pain   . Polycystic ovarian disease     Past Surgical History  Procedure Date  . Breast surgery   . Tonsillectomy     No family history on file.  History  Substance Use Topics  . Smoking status: Never Smoker   . Smokeless tobacco: Not on file  . Alcohol Use: No   Review of Systems  Constitutional: Negative for fever, chills, diaphoresis and fatigue.  HENT: Negative for congestion, rhinorrhea and sneezing.   Eyes: Negative.   Respiratory: Negative for cough, chest tightness and shortness of breath.     Cardiovascular: Negative for chest pain and leg swelling.  Gastrointestinal: Positive for nausea, vomiting and abdominal pain. Negative for diarrhea and blood in stool.  Genitourinary: Negative for frequency, hematuria, flank pain and difficulty urinating.  Musculoskeletal: Negative for back pain and arthralgias.  Skin: Negative for rash.  Neurological: Negative for dizziness, speech difficulty, weakness, numbness and headaches.  All other systems reviewed and are negative.    Allergies  Caffeine; Compazine; Decadron; Flagyl; Metoclopramide hcl; and Zofran  Home Medications   Current Outpatient Rx  Name Route Sig Dispense Refill  . LACTULOSE 10 G PO PACK Oral Take 10 g by mouth 3 (three) times daily.    . ACETAMINOPHEN 500 MG PO TABS Oral Take 1,000 mg by mouth every 6 (six) hours as needed. For pain    . CARVEDILOL 25 MG PO TABS Oral Take 25 mg by mouth 2 (two) times daily with a meal.    . CLONIDINE HCL 0.1 MG PO TABS Oral Take 0.1 mg by mouth daily as needed. For high blood pressure    . DIAZEPAM 5 MG PO TABS Oral Take 5 mg by mouth 2 (two) times daily as needed. For anxiety or back pain    . OLMESARTAN-AMLODIPINE-HCTZ 40-5-25 MG PO TABS Oral Take 1 tablet by mouth daily.      . CHEWABLE MULTIPLE VITAMINS PO Oral Take 1 tablet by mouth daily.    Marland Kitchen PROMETHAZINE HCL 25 MG PO TABS Oral Take 1 tablet (25 mg total) by mouth  every 6 (six) hours as needed for nausea. 10 tablet 0  . PROMETHAZINE HCL 25 MG PO TABS Oral Take 1 tablet (25 mg total) by mouth every 6 (six) hours as needed for nausea. 12 tablet 0  . PROMETHAZINE HCL 25 MG PO TABS Oral Take 1 tablet (25 mg total) by mouth every 6 (six) hours as needed for nausea. 15 tablet 0  . PROMETHAZINE HCL 25 MG PO TABS Oral Take 1 tablet (25 mg total) by mouth every 6 (six) hours as needed for nausea. 30 tablet 0  . VITAMIN D (ERGOCALCIFEROL) 50000 UNITS PO CAPS Oral Take 50,000 Units by mouth every 7 (seven) days. On Tuesday      BP  140/73  Pulse 98  Temp 98.4 F (36.9 C) (Oral)  Resp 16  Ht 5\' 6"  (1.676 m)  Wt 213 lb (96.616 kg)  BMI 34.38 kg/m2  SpO2 100%  LMP 06/07/2012  Physical Exam  Nursing note and vitals reviewed. Constitutional: She is oriented to person, place, and time. She appears well-developed and well-nourished.  HENT:  Head: Normocephalic and atraumatic.  Mouth/Throat: Oropharynx is clear and moist.  Eyes: Pupils are equal, round, and reactive to light.  Neck: Normal range of motion. Neck supple.  Cardiovascular: Normal rate, regular rhythm and normal heart sounds.   Pulmonary/Chest: Effort normal and breath sounds normal. No respiratory distress. She has no wheezes. She has no rales. She exhibits no tenderness.  Abdominal: Soft. Bowel sounds are normal. There is tenderness. There is no rebound and no guarding.       Moderate tenderness across epigastric region.  Musculoskeletal: Normal range of motion. She exhibits no edema.  Lymphadenopathy:    She has no cervical adenopathy.  Neurological: She is alert and oriented to person, place, and time.  Skin: Skin is warm and dry. No rash noted.  Psychiatric: She has a normal mood and affect.    ED Course  Procedures (including critical care time) DIAGNOSTIC STUDIES: Oxygen Saturation is 100% on room air, normal by my interpretation.    COORDINATION OF CARE:  Results for orders placed during the hospital encounter of 07/09/12  URINALYSIS, ROUTINE W REFLEX MICROSCOPIC      Component Value Range   Color, Urine YELLOW  YELLOW   APPearance CLEAR  CLEAR   Specific Gravity, Urine 1.009  1.005 - 1.030   pH 6.5  5.0 - 8.0   Glucose, UA NEGATIVE  NEGATIVE mg/dL   Hgb urine dipstick NEGATIVE  NEGATIVE   Bilirubin Urine NEGATIVE  NEGATIVE   Ketones, ur NEGATIVE  NEGATIVE mg/dL   Protein, ur NEGATIVE  NEGATIVE mg/dL   Urobilinogen, UA 0.2  0.0 - 1.0 mg/dL   Nitrite NEGATIVE  NEGATIVE   Leukocytes, UA TRACE (*) NEGATIVE  PREGNANCY, URINE       Component Value Range   Preg Test, Ur NEGATIVE  NEGATIVE  CBC WITH DIFFERENTIAL      Component Value Range   WBC 10.5  4.0 - 10.5 K/uL   RBC 4.30  3.87 - 5.11 MIL/uL   Hemoglobin 11.0 (*) 12.0 - 15.0 g/dL   HCT 16.1 (*) 09.6 - 04.5 %   MCV 75.6 (*) 78.0 - 100.0 fL   MCH 25.6 (*) 26.0 - 34.0 pg   MCHC 33.8  30.0 - 36.0 g/dL   RDW 40.9 (*) 81.1 - 91.4 %   Platelets 291  150 - 400 K/uL   Neutrophils Relative 57  43 - 77 %  Neutro Abs 6.0  1.7 - 7.7 K/uL   Lymphocytes Relative 30  12 - 46 %   Lymphs Abs 3.2  0.7 - 4.0 K/uL   Monocytes Relative 10  3 - 12 %   Monocytes Absolute 1.1 (*) 0.1 - 1.0 K/uL   Eosinophils Relative 2  0 - 5 %   Eosinophils Absolute 0.2  0.0 - 0.7 K/uL   Basophils Relative 0  0 - 1 %   Basophils Absolute 0.0  0.0 - 0.1 K/uL  COMPREHENSIVE METABOLIC PANEL      Component Value Range   Sodium 138  135 - 145 mEq/L   Potassium 3.6  3.5 - 5.1 mEq/L   Chloride 100  96 - 112 mEq/L   CO2 26  19 - 32 mEq/L   Glucose, Bld 126 (*) 70 - 99 mg/dL   BUN 7  6 - 23 mg/dL   Creatinine, Ser 7.42  0.50 - 1.10 mg/dL   Calcium 59.5  8.4 - 63.8 mg/dL   Total Protein 8.0  6.0 - 8.3 g/dL   Albumin 3.9  3.5 - 5.2 g/dL   AST 18  0 - 37 U/L   ALT 16  0 - 35 U/L   Alkaline Phosphatase 82  39 - 117 U/L   Total Bilirubin 0.2 (*) 0.3 - 1.2 mg/dL   GFR calc non Af Amer 84 (*) >90 mL/min   GFR calc Af Amer >90  >90 mL/min  LIPASE, BLOOD      Component Value Range   Lipase 30  11 - 59 U/L  URINE MICROSCOPIC-ADD ON      Component Value Range   Squamous Epithelial / LPF MANY (*) RARE   WBC, UA 3-6  <3 WBC/hpf   Bacteria, UA MANY (*) RARE   No results found.    1. Abdominal pain       MDM  I reviewed patient's records from her recent admission at [facility]. She was admitted on July 15 and discharged on July 17. She has a history of chronic abdominal pain for which he sees a gastroenterologist is Dr.Draelos. . she had a CT scan as well as an EGD done during  this hospitalization both of which were normal other than an ovarian cyst which was noted on CT scan. She states that she's out of her oxycodone however on review of records she gets monthly prescriptions of oxycodone from her primary care physician in this was in fact filled on July 3 for a 30 day supply. I advised her that we would not be able to prescribe any ongoing pain medicines. Her normal and she has no evidence of dehydration. I will give her a prescription for Phenergan as she says she doesn't have anything at home for the nausea. I advised her to return here as needed for worsening symptoms otherwise followup with her gastroenterologist I personally performed the services described in this documentation, which was scribed in my presence.  The recorded information has been reviewed and considered.        Rolan Bucco, MD 07/09/12 2233

## 2012-08-08 ENCOUNTER — Emergency Department (HOSPITAL_BASED_OUTPATIENT_CLINIC_OR_DEPARTMENT_OTHER): Payer: Medicare Other

## 2012-08-08 ENCOUNTER — Emergency Department (HOSPITAL_BASED_OUTPATIENT_CLINIC_OR_DEPARTMENT_OTHER)
Admission: EM | Admit: 2012-08-08 | Discharge: 2012-08-09 | Disposition: A | Discharge status: Home / Self Care | Payer: Medicare Other | Attending: Emergency Medicine | Admitting: Emergency Medicine

## 2012-08-08 ENCOUNTER — Encounter (HOSPITAL_BASED_OUTPATIENT_CLINIC_OR_DEPARTMENT_OTHER): Payer: Self-pay | Admitting: *Deleted

## 2012-08-08 DIAGNOSIS — Z79899 Other long term (current) drug therapy: Secondary | ICD-10-CM | POA: Insufficient documentation

## 2012-08-08 DIAGNOSIS — I10 Essential (primary) hypertension: Secondary | ICD-10-CM | POA: Insufficient documentation

## 2012-08-08 DIAGNOSIS — I509 Heart failure, unspecified: Secondary | ICD-10-CM | POA: Insufficient documentation

## 2012-08-08 DIAGNOSIS — K5090 Crohn's disease, unspecified, without complications: Secondary | ICD-10-CM | POA: Insufficient documentation

## 2012-08-08 DIAGNOSIS — E119 Type 2 diabetes mellitus without complications: Secondary | ICD-10-CM | POA: Insufficient documentation

## 2012-08-08 DIAGNOSIS — R109 Unspecified abdominal pain: Secondary | ICD-10-CM | POA: Insufficient documentation

## 2012-08-08 DIAGNOSIS — G8929 Other chronic pain: Secondary | ICD-10-CM | POA: Insufficient documentation

## 2012-08-08 DIAGNOSIS — G43909 Migraine, unspecified, not intractable, without status migrainosus: Secondary | ICD-10-CM | POA: Insufficient documentation

## 2012-08-08 DIAGNOSIS — R1110 Vomiting, unspecified: Secondary | ICD-10-CM

## 2012-08-08 DIAGNOSIS — E860 Dehydration: Secondary | ICD-10-CM

## 2012-08-08 LAB — PREGNANCY, URINE: Preg Test, Ur: NEGATIVE

## 2012-08-08 LAB — COMPREHENSIVE METABOLIC PANEL
ALT: 15 U/L (ref 0–35)
AST: 18 U/L (ref 0–37)
Albumin: 4.6 g/dL (ref 3.5–5.2)
Alkaline Phosphatase: 84 U/L (ref 39–117)
BUN: 8 mg/dL (ref 6–23)
Chloride: 97 mEq/L (ref 96–112)
Potassium: 3.7 mEq/L (ref 3.5–5.1)
Sodium: 137 mEq/L (ref 135–145)
Total Bilirubin: 0.3 mg/dL (ref 0.3–1.2)

## 2012-08-08 LAB — URINALYSIS, ROUTINE W REFLEX MICROSCOPIC
Glucose, UA: NEGATIVE mg/dL
Ketones, ur: NEGATIVE mg/dL
Leukocytes, UA: NEGATIVE
Nitrite: NEGATIVE
Protein, ur: NEGATIVE mg/dL
Urobilinogen, UA: 0.2 mg/dL (ref 0.0–1.0)

## 2012-08-08 LAB — CBC WITH DIFFERENTIAL/PLATELET
Basophils Relative: 0 % (ref 0–1)
Hemoglobin: 11.4 g/dL — ABNORMAL LOW (ref 12.0–15.0)
MCHC: 34.3 g/dL (ref 30.0–36.0)
Monocytes Relative: 7 % (ref 3–12)
Neutro Abs: 7.2 10*3/uL (ref 1.7–7.7)
Neutrophils Relative %: 61 % (ref 43–77)
Platelets: 311 10*3/uL (ref 150–400)
RBC: 4.39 MIL/uL (ref 3.87–5.11)

## 2012-08-08 LAB — WET PREP, GENITAL

## 2012-08-08 MED ORDER — FLUCONAZOLE 50 MG PO TABS
150.0000 mg | ORAL_TABLET | Freq: Once | ORAL | Status: AC
  Administered 2012-08-08: 150 mg via ORAL
  Filled 2012-08-08: qty 1

## 2012-08-08 MED ORDER — PROMETHAZINE HCL 25 MG/ML IJ SOLN
25.0000 mg | Freq: Once | INTRAMUSCULAR | Status: AC
  Administered 2012-08-08: 25 mg via INTRAVENOUS
  Filled 2012-08-08: qty 1

## 2012-08-08 MED ORDER — HYDROCODONE-ACETAMINOPHEN 5-325 MG PO TABS: 2.0000 | ORAL_TABLET | ORAL | Status: AC | PRN

## 2012-08-08 MED ORDER — METRONIDAZOLE 0.75 % VA GEL: 1.0000 | Freq: Two times a day (BID) | VAGINAL | Status: AC

## 2012-08-08 MED ORDER — HYDROMORPHONE HCL PF 1 MG/ML IJ SOLN
1.0000 mg | Freq: Once | INTRAMUSCULAR | Status: AC
  Administered 2012-08-08: 1 mg via INTRAVENOUS
  Filled 2012-08-08: qty 1

## 2012-08-08 MED ORDER — SODIUM CHLORIDE 0.9 % IV BOLUS (SEPSIS)
1000.0000 mL | Freq: Once | INTRAVENOUS | Status: AC
  Administered 2012-08-08: 1000 mL via INTRAVENOUS

## 2012-08-08 MED ORDER — OXYCODONE-ACETAMINOPHEN 5-325 MG PO TABS: 1.0000 | ORAL_TABLET | Freq: Four times a day (QID) | ORAL | Status: AC | PRN

## 2012-08-08 NOTE — ED Notes (Signed)
Vomiting for a while. Migraine headache x 3 days. Chest pain x 1 week. Same chest pain she has been here for in the past.  Denies cough. Thinks she may have a UTI.

## 2012-08-08 NOTE — ED Provider Notes (Addendum)
History  This chart was scribed for Gwyneth Sprout, MD by Bennett Scrape. This patient was seen in room MH10/MH10 and the patient's care was started at 7:40PM.  CSN: [id number]  Arrival date & time 08/08/12  1820   First MD Initiated Contact with Patient 08/08/12 1940      Chief Complaint  Patient presents with  . Emesis     The history is provided by the patient. No language interpreter was used.    Kristi Simpson is a 31 y.o. female who presents to the Emergency Department complaining of one month of nausea with several episodes of asscoiated non-bloody emesis described as clear and epigastric abdominal pain described as soreness attributed to gall bladder issues. The nausea is aggravated by eating and the abdominal pain is made worse with emesis. She states that she was taking phenergan suppositories and Toradol at home with no improvement in her symptoms. She reports that she has a cholecystectomy scheduled to be performed by Dr. Andrey Campanile in [place] on 08/12/12 after having one prior admission for her symptoms in July 2013. She states that she has an appointment scheduled for tomorrow to get IV fluids at [facility] and is requesting relief from her symptoms until tomorrow. She also c/o left suprapubic pain attributed to a left ovarian cyst and states that her PCP wants an [place] to recheck her ovarian cyst due to the possibility of the cyst having ruptured during her admission. She also c/o frequency, migraine headache for 3 days and chest pain for the past week. She reports that the CP is the same chest pain she has been here for in the past and denies changes. She also has a h/o migraines and states that the HA is similar to prior migraines. She denies fever, sore throat, visual disturbance, cough, SOB, diarrhea, back pain, weakness, numbness and rash as associated symptoms.  She has a h/o HTN, DM, fibromyalgia, PE and CHF. She denies smoking and alcohol use.   Past Medical History    Diagnosis Date  . Crohn disease   . Abdominal pain, unspecified site   . Hypertension   . Diabetes mellitus   . Pancreatitis   . Fibromyalgia   . Migraine   . CHF (congestive heart failure)   . Pulmonary embolism   . Chronic pain   . Polycystic ovarian disease     Past Surgical History  Procedure Date  . Breast surgery   . Tonsillectomy     No family history on file.  History  Substance Use Topics  . Smoking status: Never Smoker   . Smokeless tobacco: Not on file  . Alcohol Use: No    No OB history provided.  Review of Systems  A complete 10 system review of systems was obtained and all systems are negative except as noted in the HPI and PMH.   Allergies  Caffeine; Compazine; Decadron; Flagyl; Metoclopramide hcl; and Zofran  Home Medications   Current Outpatient Rx  Name Route Sig Dispense Refill  . ACETAMINOPHEN 500 MG PO TABS Oral Take 1,000 mg by mouth every 6 (six) hours as needed. For pain    . CARVEDILOL 25 MG PO TABS Oral Take 25 mg by mouth 2 (two) times daily with a meal.    . CLONIDINE HCL 0.1 MG PO TABS Oral Take 0.1 mg by mouth daily as needed. For high blood pressure    . DIAZEPAM 5 MG PO TABS Oral Take 5 mg by mouth 2 (two) times daily as  needed. For anxiety or back pain    . LACTULOSE 10 G PO PACK Oral Take 10 g by mouth 3 (three) times daily.    Marland Kitchen OLMESARTAN-AMLODIPINE-HCTZ 40-5-25 MG PO TABS Oral Take 1 tablet by mouth daily.      . CHEWABLE MULTIPLE VITAMINS PO Oral Take 1 tablet by mouth daily.    Marland Kitchen PROMETHAZINE HCL 25 MG PO TABS Oral Take 1 tablet (25 mg total) by mouth every 6 (six) hours as needed for nausea. 10 tablet 0  . PROMETHAZINE HCL 25 MG PO TABS Oral Take 1 tablet (25 mg total) by mouth every 6 (six) hours as needed for nausea. 12 tablet 0  . PROMETHAZINE HCL 25 MG PO TABS Oral Take 1 tablet (25 mg total) by mouth every 6 (six) hours as needed for nausea. 15 tablet 0  . PROMETHAZINE HCL 25 MG PO TABS Oral Take 1 tablet (25 mg total)  by mouth every 6 (six) hours as needed for nausea. 30 tablet 0  . VITAMIN D (ERGOCALCIFEROL) 50000 UNITS PO CAPS Oral Take 50,000 Units by mouth every 7 (seven) days. On Tuesday      Triage Vitals: BP 124/86  Pulse 87  Temp 97.8 F (36.6 C) (Oral)  Resp 16  SpO2 100%  Physical Exam  Nursing note and vitals reviewed. Constitutional: She is oriented to person, place, and time. She appears well-developed and well-nourished. No distress.  HENT:  Head: Normocephalic and atraumatic.  Eyes: EOM are normal.  Neck: Neck supple. No tracheal deviation present.  Cardiovascular: Normal rate and regular rhythm.   No murmur heard. Pulmonary/Chest: Effort normal and breath sounds normal. No respiratory distress.  Abdominal: Soft. Bowel sounds are normal. There is tenderness (diffuse tenderness that is more pronounced in RUQ and left suprapubic area). There is guarding (mild). There is no rebound.       No CVa tenderness  Musculoskeletal: Normal range of motion. She exhibits no edema.  Neurological: She is alert and oriented to person, place, and time.  Skin: Skin is warm and dry.  Psychiatric: She has a normal mood and affect. Her behavior is normal.    ED Course  Procedures (including critical care time)  DIAGNOSTIC STUDIES: Oxygen Saturation is 100% on room air, normal by my interpretation.    COORDINATION OF CARE: 7:54PM-Discussed treatment plan which includes a pelvic exam, [place], IV fluids and pain and antiemetics medications with pt at bedside and pt agreed to plan.   Labs Reviewed  CBC WITH DIFFERENTIAL - Abnormal; Notable for the following:    WBC 11.8 (*)     Hemoglobin 11.4 (*)     HCT 33.2 (*)     MCV 75.6 (*)     RDW 16.6 (*)     All other components within normal limits  COMPREHENSIVE METABOLIC PANEL - Abnormal; Notable for the following:    Glucose, Bld 103 (*)     Calcium 10.7 (*)     Total Protein 8.9 (*)     All other components within normal limits  WET PREP,  GENITAL - Abnormal; Notable for the following:    Clue Cells Wet Prep HPF POC MODERATE (*)     WBC, Wet Prep HPF POC FEW (*)     All other components within normal limits  URINALYSIS, ROUTINE W REFLEX MICROSCOPIC  PREGNANCY, URINE  GC/CHLAMYDIA PROBE AMP, GENITAL   US Transvaginal Non-ob  08/08/2012  *RADIOLOGY REPORT*  Clinical Data: Left lower quadrant pelvic pain.  History  of ovarian cysts and polycystic ovary disease.  TRANSABDOMINAL AND TRANSVAGINAL ULTRASOUND OF PELVIS Technique:  Both transabdominal and transvaginal ultrasound examinations of the pelvis were performed. Transabdominal technique was performed for global imaging of the pelvis including uterus, ovaries, adnexal regions, and pelvic cul-de-sac.  It was necessary to proceed with endovaginal exam following the transabdominal exam to visualize the uterus, endometrium, and ovaries.  Comparison:  07/21/2011  Findings:  Uterus: The uterus appears retroflexed and measures about 6.6 x 4.3 x 5.1 cm.  No focal myometrial mass lesions demonstrated.  Small Nabothian cysts in the cervix.  Endometrium: Normal endometrial stripe thickness, measured at 7.9 mm.  No endometrial fluid collections.  Right ovary:  The right ovary measures 3.4 x 2.6 x 1.9 cm.  Normal follicular changes.  Flow is demonstrated in the right ovary on color flow Doppler imaging.  Left ovary: The left ovary measures 3.7 x 2 x 2.4 cm.  Normal follicular changes.  Flow is demonstrated in the left ovary on color flow Doppler imaging.  Other findings: There is moderate free pelvic fluid in the cul-de- sac and extending to the right adnexal region.  Fluid demonstrates internal echoes consistent with hemorrhagic or infected fluid versus proteinaceous fluid.  In the cul-de-sac adjacent to the right ovary, there is a anechoic cystic structure with thin walled measuring about 1.5 cm in diameter.  No internal structures demonstrated.  This may represent a adnexal cyst there is a phytic ovarian  cyst.  IMPRESSION: Normal appearance of the uterus and ovaries.  Moderate amount of complex fluid in the pelvis.  Simple appearing cystic structure in the cul-de-sac/right adnexal region.  This looks have been present on the previous study.   Original Report Authenticated By: Marlon Pel, M.D.    US Pelvis Complete  08/08/2012  *RADIOLOGY REPORT*  Clinical Data: Left lower quadrant pelvic pain.  History of ovarian cysts and polycystic ovary disease.  TRANSABDOMINAL AND TRANSVAGINAL ULTRASOUND OF PELVIS Technique:  Both transabdominal and transvaginal ultrasound examinations of the pelvis were performed. Transabdominal technique was performed for global imaging of the pelvis including uterus, ovaries, adnexal regions, and pelvic cul-de-sac.  It was necessary to proceed with endovaginal exam following the transabdominal exam to visualize the uterus, endometrium, and ovaries.  Comparison:  07/21/2011  Findings:  Uterus: The uterus appears retroflexed and measures about 6.6 x 4.3 x 5.1 cm.  No focal myometrial mass lesions demonstrated.  Small Nabothian cysts in the cervix.  Endometrium: Normal endometrial stripe thickness, measured at 7.9 mm.  No endometrial fluid collections.  Right ovary:  The right ovary measures 3.4 x 2.6 x 1.9 cm.  Normal follicular changes.  Flow is demonstrated in the right ovary on color flow Doppler imaging.  Left ovary: The left ovary measures 3.7 x 2 x 2.4 cm.  Normal follicular changes.  Flow is demonstrated in the left ovary on color flow Doppler imaging.  Other findings: There is moderate free pelvic fluid in the cul-de- sac and extending to the right adnexal region.  Fluid demonstrates internal echoes consistent with hemorrhagic or infected fluid versus proteinaceous fluid.  In the cul-de-sac adjacent to the right ovary, there is a anechoic cystic structure with thin walled measuring about 1.5 cm in diameter.  No internal structures demonstrated.  This may represent a adnexal  cyst there is a phytic ovarian cyst.  IMPRESSION: Normal appearance of the uterus and ovaries.  Moderate amount of complex fluid in the pelvis.  Simple appearing cystic structure in the  cul-de-sac/right adnexal region.  This looks have been present on the previous study.   Original Report Authenticated By: Marlon Pel, M.D.      1. Abdominal  pain, other specified site   2. Vomiting   3. Dehydration   4. Migraine       MDM   Patient with multiple symptoms including abdominal pain, vomiting, pelvic pain and discharge. Patient has been seen multiple times in the emergency room as well as admitted to the hospital for the same thing. She states that she was in the hospital for a month and is currently n.p.o. and receives fluids every other day at her doctor's office. She has followup with GI and states that she will be getting a colonoscopy next week and then removal of her gallbladder at the end of August. Patient came today because her by mouth endocrine at home was not working and she was vomiting it up as well as the Toradol. She also states she has a history of ovarian cysts and her doctor wanted to ensure that that was not the source of her pain. Patient's exam is negative except for diffuse abdominal pain without any peritoneal findings. Pelvic exam with vaginal discharge and diffuse tenderness. Wet prep consistent with bacterial vaginosis but no other significant findings. Lab tests including CBC, CMP, lipase are all within normal limits. UA is within normal limits. Patient given IV hydration as well as pain and nausea control. Transvaginal ultrasound negative for acute pathology but does show persistent ovarian cyst which is unchanged. Patient discharged home to followup with her doctor at 10:15 tomorrow as planned.      I personally performed the services described in this documentation, which was scribed in my presence.  The recorded information has been reviewed and  considered.   Gwyneth Sprout, MD 08/08/12 1610  Gwyneth Sprout, MD 08/08/12 [id number]  Gwyneth Sprout, MD 08/08/12 [phone]

## 2012-08-10 LAB — GC/CHLAMYDIA PROBE AMP, GENITAL
Chlamydia, DNA Probe: NEGATIVE
GC Probe Amp, Genital: NEGATIVE

## 2012-08-16 ENCOUNTER — Encounter (HOSPITAL_BASED_OUTPATIENT_CLINIC_OR_DEPARTMENT_OTHER): Payer: Self-pay | Admitting: Emergency Medicine

## 2012-08-16 ENCOUNTER — Emergency Department (HOSPITAL_BASED_OUTPATIENT_CLINIC_OR_DEPARTMENT_OTHER)
Admission: EM | Admit: 2012-08-16 | Discharge: 2012-08-16 | Disposition: A | Discharge status: Home / Self Care | Payer: Medicare Other | Attending: Emergency Medicine | Admitting: Emergency Medicine

## 2012-08-16 DIAGNOSIS — R197 Diarrhea, unspecified: Secondary | ICD-10-CM | POA: Insufficient documentation

## 2012-08-16 DIAGNOSIS — IMO0001 Reserved for inherently not codable concepts without codable children: Secondary | ICD-10-CM | POA: Insufficient documentation

## 2012-08-16 DIAGNOSIS — K5090 Crohn's disease, unspecified, without complications: Secondary | ICD-10-CM | POA: Insufficient documentation

## 2012-08-16 DIAGNOSIS — R1084 Generalized abdominal pain: Secondary | ICD-10-CM | POA: Insufficient documentation

## 2012-08-16 DIAGNOSIS — R112 Nausea with vomiting, unspecified: Secondary | ICD-10-CM | POA: Insufficient documentation

## 2012-08-16 DIAGNOSIS — I10 Essential (primary) hypertension: Secondary | ICD-10-CM | POA: Insufficient documentation

## 2012-08-16 DIAGNOSIS — R1110 Vomiting, unspecified: Secondary | ICD-10-CM

## 2012-08-16 DIAGNOSIS — R791 Abnormal coagulation profile: Secondary | ICD-10-CM | POA: Insufficient documentation

## 2012-08-16 DIAGNOSIS — Z7901 Long term (current) use of anticoagulants: Secondary | ICD-10-CM | POA: Insufficient documentation

## 2012-08-16 DIAGNOSIS — E119 Type 2 diabetes mellitus without complications: Secondary | ICD-10-CM | POA: Insufficient documentation

## 2012-08-16 DIAGNOSIS — R109 Unspecified abdominal pain: Secondary | ICD-10-CM

## 2012-08-16 DIAGNOSIS — Z86711 Personal history of pulmonary embolism: Secondary | ICD-10-CM | POA: Insufficient documentation

## 2012-08-16 HISTORY — DX: Ulcer of intestine: K63.3

## 2012-08-16 LAB — URINALYSIS, ROUTINE W REFLEX MICROSCOPIC
Bilirubin Urine: NEGATIVE
Glucose, UA: NEGATIVE mg/dL
Hgb urine dipstick: NEGATIVE
Specific Gravity, Urine: 1.024 (ref 1.005–1.030)
Urobilinogen, UA: 0.2 mg/dL (ref 0.0–1.0)

## 2012-08-16 LAB — COMPREHENSIVE METABOLIC PANEL
ALT: 15 U/L (ref 0–35)
AST: 17 U/L (ref 0–37)
Albumin: 4.3 g/dL (ref 3.5–5.2)
Alkaline Phosphatase: 90 U/L (ref 39–117)
BUN: 6 mg/dL (ref 6–23)
CO2: 25 mEq/L (ref 19–32)
Calcium: 9.9 mg/dL (ref 8.4–10.5)
Chloride: 100 mEq/L (ref 96–112)
Creatinine, Ser: 0.7 mg/dL (ref 0.50–1.10)
GFR calc Af Amer: >90 mL/min (ref >90)
GFR calc non Af Amer: >90 mL/min (ref >90)
Glucose, Bld: 145 mg/dL — ABNORMAL HIGH (ref 70–99)
Potassium: 3.9 mEq/L (ref 3.5–5.1)
Sodium: 136 mEq/L (ref 135–145)
Total Bilirubin: 0.2 mg/dL — ABNORMAL LOW (ref 0.3–1.2)
Total Protein: 8.5 g/dL — ABNORMAL HIGH (ref 6.0–8.3)

## 2012-08-16 LAB — CBC WITH DIFFERENTIAL/PLATELET
Basophils Absolute: 0 10*3/uL (ref 0.0–0.1)
Lymphocytes Relative: 30 % (ref 12–46)
Lymphs Abs: 3.2 10*3/uL (ref 0.7–4.0)
Neutro Abs: 6.4 10*3/uL (ref 1.7–7.7)
Neutrophils Relative %: 60 % (ref 43–77)
Platelets: 302 10*3/uL (ref 150–400)
RBC: 4.45 MIL/uL (ref 3.87–5.11)
RDW: 16.3 % — ABNORMAL HIGH (ref 11.5–15.5)
WBC: 10.6 10*3/uL — ABNORMAL HIGH (ref 4.0–10.5)

## 2012-08-16 LAB — RAPID URINE DRUG SCREEN, HOSP PERFORMED
Amphetamines: NOT DETECTED
Benzodiazepines: POSITIVE — AB
Opiates: POSITIVE — AB
Tetrahydrocannabinol: NOT DETECTED

## 2012-08-16 LAB — PREGNANCY, URINE: Preg Test, Ur: NEGATIVE

## 2012-08-16 MED ORDER — PROMETHAZINE HCL 25 MG RE SUPP: 25.0000 mg | Freq: Four times a day (QID) | RECTAL | Status: DC | PRN

## 2012-08-16 MED ORDER — HYDROMORPHONE HCL PF 1 MG/ML IJ SOLN: 1.0000 mg | Freq: Once | INTRAMUSCULAR | Status: DC

## 2012-08-16 MED ORDER — MORPHINE SULFATE 4 MG/ML IJ SOLN: 4.0000 mg | Freq: Once | INTRAMUSCULAR | Status: DC

## 2012-08-16 MED ORDER — SODIUM CHLORIDE 0.9 % IV BOLUS (SEPSIS): 1000.0000 mL | Freq: Once | INTRAVENOUS | Status: DC

## 2012-08-16 MED ORDER — PROMETHAZINE HCL 25 MG/ML IJ SOLN
25.0000 mg | Freq: Once | INTRAMUSCULAR | Status: DC
Start: 2012-08-16 — End: 2012-08-16

## 2012-08-16 MED ORDER — MORPHINE SULFATE 4 MG/ML IJ SOLN
4.0000 mg | Freq: Once | INTRAMUSCULAR | Status: AC
  Administered 2012-08-16: 4 mg via INTRAMUSCULAR
  Filled 2012-08-16: qty 1

## 2012-08-16 NOTE — ED Notes (Signed)
D/C instructions regarding INR, follow up with [facility] and GI physician, Rx and diet discussed with pt by EDP.  Pt pleasant upon d/c and verbalizes understanding.  Pt states she will follow up with her doctor this am.

## 2012-08-16 NOTE — ED Notes (Signed)
Pt has large amount of scar tissue in left ventrogluteal area.

## 2012-08-16 NOTE — ED Provider Notes (Signed)
History     CSN: [id number]  Arrival date & time 08/16/12  0522   First MD Initiated Contact with Patient 08/16/12 0532      Chief Complaint  Patient presents with  . Abdominal Pain  . Emesis    (Consider location/radiation/quality/duration/timing/severity/associated sxs/prior treatment) Patient is a 31 y.o. female presenting with abdominal pain, vomiting, and diarrhea. The history is provided by the patient. No language interpreter was used.  Abdominal Pain The primary symptoms of the illness include abdominal pain, nausea, vomiting and diarrhea. The primary symptoms of the illness do not include fever, shortness of breath, dysuria or vaginal discharge. The current episode started more than 2 days ago (more than a month ago). The onset of the illness was gradual. The problem has not changed since onset. The abdominal pain began more than 2 days ago. The pain came on gradually. The abdominal pain has been unchanged since its onset. The abdominal pain is generalized. The abdominal pain does not radiate. The severity of the abdominal pain is 10/10. The abdominal pain is relieved by nothing. Exacerbated by: nothing.  Nausea began more than 1 week ago. Associated with: abdominal pain. Exacerbated by: nothing.  The vomiting began more than 2 days ago. Frequency: many times a day even using phenergan suppositories. The emesis contains stomach contents. Risk factors: crohns and biliary disease.  Associated with: crohns and biliary disease. The patient states that she believes she is currently not pregnant. The patient has had a change in bowel habit. Risk factors: none, says she is scheduled to see Dr. Darla Lesches thursday  in consultation for cholecystectomy. Symptoms associated with the illness do not include diaphoresis. Significant associated medical issues include inflammatory bowel disease.  Emesis  This is a recurrent problem. The problem has not changed since onset.The emesis has an appearance of  stomach contents. There has been no fever. Associated symptoms include abdominal pain and diarrhea. Pertinent negatives include no fever.  Diarrhea The primary symptoms include abdominal pain, nausea, vomiting and diarrhea. Primary symptoms do not include fever or dysuria. The illness began more than 7 days ago. The onset was gradual. The problem has been rapidly worsening.  The abdominal pain began more than 2 days ago. The abdominal pain has been gradually worsening since its onset. The abdominal pain is generalized. The abdominal pain does not radiate. The severity of the abdominal pain is 10/10. The abdominal pain is relieved by nothing.  The vomiting began more than 2 days ago. The emesis contains stomach contents.  Associated symptoms comments: None she is taking Asacol for less than a week and states it is not helping at this time. Significant associated medical issues include inflammatory bowel disease.    Past Medical History  Diagnosis Date  . Crohn disease   . Abdominal pain, unspecified site   . Hypertension   . Diabetes mellitus   . Pancreatitis   . Fibromyalgia   . Migraine   . CHF (congestive heart failure)   . Pulmonary embolism   . Chronic pain   . Polycystic ovarian disease   . Ulcer of ileum     Past Surgical History  Procedure Date  . Breast surgery   . Tonsillectomy     No family history on file.  History  Substance Use Topics  . Smoking status: Never Smoker   . Smokeless tobacco: Not on file  . Alcohol Use: No    OB History    Grav Para Term Preterm Abortions TAB SAB Ect  Mult Living                  Review of Systems  Constitutional: Negative for fever and diaphoresis.  Respiratory: Negative for chest tightness and shortness of breath.   Cardiovascular: Negative for chest pain, palpitations and leg swelling.  Gastrointestinal: Positive for nausea, vomiting, abdominal pain and diarrhea.  Genitourinary: Negative.  Negative for dysuria and vaginal  discharge.  All other systems reviewed and are negative.    Allergies  Caffeine; Compazine; Decadron; Flagyl; Metoclopramide hcl; and Zofran  Home Medications   Current Outpatient Rx  Name Route Sig Dispense Refill  . MESALAMINE 400 MG PO TBEC Oral Take 800 mg by mouth daily.    . ACETAMINOPHEN 500 MG PO TABS Oral Take 1,000 mg by mouth every 6 (six) hours as needed. For pain    . CARVEDILOL 25 MG PO TABS Oral Take 25 mg by mouth 2 (two) times daily with a meal.    . CLONIDINE HCL 0.1 MG PO TABS Oral Take 0.1 mg by mouth daily as needed. For high blood pressure    . DIAZEPAM 5 MG PO TABS Oral Take 5 mg by mouth 2 (two) times daily as needed. For anxiety or back pain    . HYDROCODONE-ACETAMINOPHEN 5-325 MG PO TABS Oral Take 2 tablets by mouth every 4 (four) hours as needed for pain. 6 tablet 0  . LACTULOSE 10 G PO PACK Oral Take 10 g by mouth 3 (three) times daily.    Marland Kitchen METRONIDAZOLE 0.75 % VA GEL Vaginal Place 1 Applicatorful vaginally 2 (two) times daily. 70 g 0  . OLMESARTAN-AMLODIPINE-HCTZ 40-5-25 MG PO TABS Oral Take 1 tablet by mouth daily.      . OXYCODONE-ACETAMINOPHEN 5-325 MG PO TABS Oral Take 1 tablet by mouth every 6 (six) hours as needed for pain. 15 tablet 0  . CHEWABLE MULTIPLE VITAMINS PO Oral Take 1 tablet by mouth daily.    Marland Kitchen PROMETHAZINE HCL 25 MG PO TABS Oral Take 1 tablet (25 mg total) by mouth every 6 (six) hours as needed for nausea. 10 tablet 0  . PROMETHAZINE HCL 25 MG PO TABS Oral Take 1 tablet (25 mg total) by mouth every 6 (six) hours as needed for nausea. 12 tablet 0  . PROMETHAZINE HCL 25 MG PO TABS Oral Take 1 tablet (25 mg total) by mouth every 6 (six) hours as needed for nausea. 15 tablet 0  . PROMETHAZINE HCL 25 MG PO TABS Oral Take 1 tablet (25 mg total) by mouth every 6 (six) hours as needed for nausea. 30 tablet 0  . VITAMIN D (ERGOCALCIFEROL) 50000 UNITS PO CAPS Oral Take 50,000 Units by mouth every 7 (seven) days. On Tuesday      BP 166/90  Pulse  94  Temp 97.9 F (36.6 C) (Oral)  Resp 18  SpO2 99%  Physical Exam  Constitutional: She is oriented to person, place, and time. She appears well-developed and well-nourished. No distress.  HENT:  Head: Normocephalic and atraumatic.  Mouth/Throat: Oropharynx is clear and moist.  Eyes: Conjunctivae are normal. Pupils are equal, round, and reactive to light.  Neck: Normal range of motion. Neck supple.  Cardiovascular: Normal rate and regular rhythm.   Pulmonary/Chest: Effort normal and breath sounds normal. She has no wheezes. She has no rales.  Abdominal: Soft. Normal appearance and bowel sounds are normal. She exhibits no shifting dullness, no fluid wave and no mass. There is generalized tenderness. There is no rigidity,  no rebound, no guarding, no tenderness at McBurney's point and negative Murphy's sign.  Musculoskeletal: Normal range of motion.  Neurological: She is alert and oriented to person, place, and time.  Skin: Skin is warm and dry.  Psychiatric: She has a normal mood and affect.    ED Course  Procedures (including critical care time)   Labs Reviewed  CBC WITH DIFFERENTIAL  COMPREHENSIVE METABOLIC PANEL  LIPASE, BLOOD  URINALYSIS, ROUTINE W REFLEX MICROSCOPIC  PREGNANCY, URINE   No results found.   No diagnosis found.    MDM  Patient with chronic abdominal pain.  Urine obtained prior to medications given in ED. Patient has no IV access and was a difficult stick for labs some obtained by arterial stick.  Urine not concentrated.  Will proceed with IM medications for nausea and pain.  Patient has had 2 prescriptions within that past 10 days here and has had 90 pills of oxycodone on 7/31. 15 tabs of Dilaudid on 07/19/12.  Patient told staff she goes to the coumadin clinic everyday and has therapeutic INR.  Coumadin has never been listed under her medications nor has lovenox.  INR in ED returned at 1.0.  Patient informed by EDP of INR of 1.0.  Patient then stated she is  on lovenox 80 mg and also coumadin but can't remember her dose of coumadin but thinks her INR was therapeutic.  She was told she must immediately follow up at the coumadin clinic today at discharge on 08/16/12 and must tell them she has a subtherapeutic INR and to increased her coumadin dose.  Patient verbalizes understanding and agrees to follow up.  The INR and need to follow immediately up is also printed on discharge papers. Called to patient room, spoke to the patient in private with no other individuals in the room with door closed.  Patient states she feels offended that she was given IM medication because she stated she told multiple people where to insert the IV.  EDP explained that multiple attempts were made and unsuccessful at IV.  EDP apologized for the patient's inconvenience.  Patient seemed happy and reassured following discussion with EDP.  EDP assured patient that we are striving to treat her pain and make sure she is safe for outpatient follow up.  No RX for narcotics to go follow up with Dr. Kathrynn Speed for ongoing pain management.         Jasmine Awe, MD 08/16/12 [phone]

## 2012-08-16 NOTE — ED Notes (Signed)
MD at bedside. 

## 2012-08-16 NOTE — ED Notes (Signed)
Report received from Kellie Neal, RN, care assumed. 

## 2012-08-16 NOTE — ED Notes (Signed)
Pt states she is on coumadin and lovenox, and bruising on arms comes from having coumadin levels checked. INR is not therapeutic and pt instructed to follow up with coumadin clinic today.

## 2012-08-16 NOTE — ED Notes (Signed)
Pt dx with ulcer of ileum and gallbladder disease. Pt has apt with Dr. Darla Lesches for gallbladder this thurs. Pt was given phenergan suppositories and asacol. Pt state pain is not improved.

## 2012-08-16 NOTE — ED Notes (Signed)
Attempted blood draw x 2 unsuccessful. Pt has bruising to both arms from multiple venipunctures.

## 2012-11-20 ENCOUNTER — Encounter (HOSPITAL_BASED_OUTPATIENT_CLINIC_OR_DEPARTMENT_OTHER): Payer: Self-pay | Admitting: *Deleted

## 2012-11-20 ENCOUNTER — Emergency Department (HOSPITAL_BASED_OUTPATIENT_CLINIC_OR_DEPARTMENT_OTHER): Payer: Medicare Other

## 2012-11-20 ENCOUNTER — Emergency Department (HOSPITAL_BASED_OUTPATIENT_CLINIC_OR_DEPARTMENT_OTHER)
Admission: EM | Admit: 2012-11-20 | Discharge: 2012-11-20 | Disposition: A | Discharge status: Home / Self Care | Payer: Medicare Other | Attending: Emergency Medicine | Admitting: Emergency Medicine

## 2012-11-20 DIAGNOSIS — Z8719 Personal history of other diseases of the digestive system: Secondary | ICD-10-CM | POA: Insufficient documentation

## 2012-11-20 DIAGNOSIS — K5090 Crohn's disease, unspecified, without complications: Secondary | ICD-10-CM | POA: Insufficient documentation

## 2012-11-20 DIAGNOSIS — I509 Heart failure, unspecified: Secondary | ICD-10-CM | POA: Insufficient documentation

## 2012-11-20 DIAGNOSIS — E119 Type 2 diabetes mellitus without complications: Secondary | ICD-10-CM | POA: Insufficient documentation

## 2012-11-20 DIAGNOSIS — Z8639 Personal history of other endocrine, nutritional and metabolic disease: Secondary | ICD-10-CM | POA: Insufficient documentation

## 2012-11-20 DIAGNOSIS — Z8739 Personal history of other diseases of the musculoskeletal system and connective tissue: Secondary | ICD-10-CM | POA: Insufficient documentation

## 2012-11-20 DIAGNOSIS — Z79899 Other long term (current) drug therapy: Secondary | ICD-10-CM | POA: Insufficient documentation

## 2012-11-20 DIAGNOSIS — G8929 Other chronic pain: Secondary | ICD-10-CM | POA: Insufficient documentation

## 2012-11-20 DIAGNOSIS — Z86718 Personal history of other venous thrombosis and embolism: Secondary | ICD-10-CM | POA: Insufficient documentation

## 2012-11-20 DIAGNOSIS — R1013 Epigastric pain: Secondary | ICD-10-CM | POA: Insufficient documentation

## 2012-11-20 DIAGNOSIS — Z8669 Personal history of other diseases of the nervous system and sense organs: Secondary | ICD-10-CM | POA: Insufficient documentation

## 2012-11-20 DIAGNOSIS — Z862 Personal history of diseases of the blood and blood-forming organs and certain disorders involving the immune mechanism: Secondary | ICD-10-CM | POA: Insufficient documentation

## 2012-11-20 DIAGNOSIS — I10 Essential (primary) hypertension: Secondary | ICD-10-CM | POA: Insufficient documentation

## 2012-11-20 DIAGNOSIS — M5430 Sciatica, unspecified side: Secondary | ICD-10-CM | POA: Insufficient documentation

## 2012-11-20 LAB — CBC WITH DIFFERENTIAL/PLATELET
Basophils Relative: 0 % (ref 0–1)
HCT: 31.3 % — ABNORMAL LOW (ref 36.0–46.0)
Hemoglobin: 10.7 g/dL — ABNORMAL LOW (ref 12.0–15.0)
Lymphs Abs: 3 10*3/uL (ref 0.7–4.0)
MCH: 25.8 pg — ABNORMAL LOW (ref 26.0–34.0)
MCHC: 34.2 g/dL (ref 30.0–36.0)
Monocytes Absolute: 0.9 10*3/uL (ref 0.1–1.0)
Monocytes Relative: 10 % (ref 3–12)
Neutro Abs: 4.8 10*3/uL (ref 1.7–7.7)
Neutrophils Relative %: 54 % (ref 43–77)
RBC: 4.14 MIL/uL (ref 3.87–5.11)

## 2012-11-20 LAB — URINALYSIS, ROUTINE W REFLEX MICROSCOPIC
Bilirubin Urine: NEGATIVE
Glucose, UA: NEGATIVE mg/dL
Specific Gravity, Urine: 1.013 (ref 1.005–1.030)
pH: 5.5 (ref 5.0–8.0)

## 2012-11-20 LAB — COMPREHENSIVE METABOLIC PANEL
Alkaline Phosphatase: 93 U/L (ref 39–117)
BUN: 7 mg/dL (ref 6–23)
Chloride: 97 mEq/L (ref 96–112)
Creatinine, Ser: 0.5 mg/dL (ref 0.50–1.10)
GFR calc Af Amer: >90 mL/min (ref >90)
GFR calc non Af Amer: >90 mL/min (ref >90)
Glucose, Bld: 174 mg/dL — ABNORMAL HIGH (ref 70–99)
Potassium: 3.5 mEq/L (ref 3.5–5.1)
Total Bilirubin: 0.1 mg/dL — ABNORMAL LOW (ref 0.3–1.2)

## 2012-11-20 LAB — URINE MICROSCOPIC-ADD ON

## 2012-11-20 LAB — RAPID URINE DRUG SCREEN, HOSP PERFORMED: Opiates: NOT DETECTED

## 2012-11-20 LAB — LIPASE, BLOOD: Lipase: 37 U/L (ref 11–59)

## 2012-11-20 MED ORDER — SODIUM CHLORIDE 0.9 % IV SOLN
Freq: Once | INTRAVENOUS | Status: AC
  Administered 2012-11-20: 16:00:00 via INTRAVENOUS

## 2012-11-20 MED ORDER — OXYCODONE-ACETAMINOPHEN 5-325 MG PO TABS
1.0000 | ORAL_TABLET | Freq: Once | ORAL | Status: AC
  Administered 2012-11-20: 1 via ORAL
  Filled 2012-11-20 (×2): qty 1

## 2012-11-20 MED ORDER — PROMETHAZINE HCL 25 MG/ML IJ SOLN
25.0000 mg | Freq: Once | INTRAMUSCULAR | Status: AC
  Administered 2012-11-20: 25 mg via INTRAVENOUS
  Filled 2012-11-20: qty 1

## 2012-11-20 NOTE — ED Notes (Signed)
Patient also c/o having vaginal d/c and spotting
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
## 2012-11-20 NOTE — ED Notes (Signed)
Patient transported to X-ray 
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
## 2013-02-16 ENCOUNTER — Encounter (HOSPITAL_BASED_OUTPATIENT_CLINIC_OR_DEPARTMENT_OTHER): Payer: Self-pay | Admitting: *Deleted

## 2013-02-16 ENCOUNTER — Other Ambulatory Visit: Payer: Self-pay

## 2013-02-16 ENCOUNTER — Emergency Department (HOSPITAL_BASED_OUTPATIENT_CLINIC_OR_DEPARTMENT_OTHER)
Admission: EM | Admit: 2013-02-16 | Discharge: 2013-02-16 | Disposition: A | Discharge status: Home / Self Care | Payer: Medicare Other | Attending: Emergency Medicine | Admitting: Emergency Medicine

## 2013-02-16 DIAGNOSIS — Z8719 Personal history of other diseases of the digestive system: Secondary | ICD-10-CM | POA: Insufficient documentation

## 2013-02-16 DIAGNOSIS — R1110 Vomiting, unspecified: Secondary | ICD-10-CM | POA: Insufficient documentation

## 2013-02-16 DIAGNOSIS — Z86711 Personal history of pulmonary embolism: Secondary | ICD-10-CM | POA: Insufficient documentation

## 2013-02-16 DIAGNOSIS — E119 Type 2 diabetes mellitus without complications: Secondary | ICD-10-CM | POA: Insufficient documentation

## 2013-02-16 DIAGNOSIS — I509 Heart failure, unspecified: Secondary | ICD-10-CM | POA: Insufficient documentation

## 2013-02-16 DIAGNOSIS — R072 Precordial pain: Secondary | ICD-10-CM | POA: Insufficient documentation

## 2013-02-16 DIAGNOSIS — R109 Unspecified abdominal pain: Secondary | ICD-10-CM | POA: Insufficient documentation

## 2013-02-16 DIAGNOSIS — Z79899 Other long term (current) drug therapy: Secondary | ICD-10-CM | POA: Insufficient documentation

## 2013-02-16 DIAGNOSIS — G8929 Other chronic pain: Secondary | ICD-10-CM | POA: Insufficient documentation

## 2013-02-16 DIAGNOSIS — Z8679 Personal history of other diseases of the circulatory system: Secondary | ICD-10-CM | POA: Insufficient documentation

## 2013-02-16 DIAGNOSIS — Z862 Personal history of diseases of the blood and blood-forming organs and certain disorders involving the immune mechanism: Secondary | ICD-10-CM | POA: Insufficient documentation

## 2013-02-16 DIAGNOSIS — Z8639 Personal history of other endocrine, nutritional and metabolic disease: Secondary | ICD-10-CM | POA: Insufficient documentation

## 2013-02-16 DIAGNOSIS — I10 Essential (primary) hypertension: Secondary | ICD-10-CM | POA: Insufficient documentation

## 2013-02-16 DIAGNOSIS — E282 Polycystic ovarian syndrome: Secondary | ICD-10-CM | POA: Insufficient documentation

## 2013-02-16 DIAGNOSIS — M545 Low back pain, unspecified: Secondary | ICD-10-CM | POA: Insufficient documentation

## 2013-02-16 DIAGNOSIS — Z8739 Personal history of other diseases of the musculoskeletal system and connective tissue: Secondary | ICD-10-CM | POA: Insufficient documentation

## 2013-02-16 LAB — CBC WITH DIFFERENTIAL/PLATELET
Eosinophils Relative: 2 % (ref 0–5)
HCT: 34.3 % — ABNORMAL LOW (ref 36.0–46.0)
Hemoglobin: 11.5 g/dL — ABNORMAL LOW (ref 12.0–15.0)
Lymphocytes Relative: 37 % (ref 12–46)
Lymphs Abs: 3.9 10*3/uL (ref 0.7–4.0)
MCH: 26 pg (ref 26.0–34.0)
MCV: 77.6 fL — ABNORMAL LOW (ref 78.0–100.0)
Monocytes Relative: 8 % (ref 3–12)
Platelets: 301 10*3/uL (ref 150–400)
RBC: 4.42 MIL/uL (ref 3.87–5.11)
WBC: 10.3 10*3/uL (ref 4.0–10.5)

## 2013-02-16 LAB — COMPREHENSIVE METABOLIC PANEL
ALT: 21 U/L (ref 0–35)
Alkaline Phosphatase: 84 U/L (ref 39–117)
BUN: 12 mg/dL (ref 6–23)
CO2: 29 mEq/L (ref 19–32)
Calcium: 10.3 mg/dL (ref 8.4–10.5)
GFR calc Af Amer: >90 mL/min (ref >90)
GFR calc non Af Amer: >90 mL/min (ref >90)
Glucose, Bld: 131 mg/dL — ABNORMAL HIGH (ref 70–99)
Sodium: 137 mEq/L (ref 135–145)

## 2013-02-16 LAB — LIPASE, BLOOD: Lipase: 32 U/L (ref 11–59)

## 2013-02-16 MED ORDER — SODIUM CHLORIDE 0.9 % IV BOLUS (SEPSIS)
1000.0000 mL | Freq: Once | INTRAVENOUS | Status: AC
  Administered 2013-02-16: 1000 mL via INTRAVENOUS

## 2013-02-16 MED ORDER — PROMETHAZINE HCL 25 MG/ML IJ SOLN
INTRAMUSCULAR | Status: AC
  Filled 2013-02-16: qty 1

## 2013-02-16 MED ORDER — PROMETHAZINE HCL 25 MG PO TABS: 25.0000 mg | ORAL_TABLET | Freq: Four times a day (QID) | ORAL | Status: DC | PRN

## 2013-02-16 MED ORDER — KETOROLAC TROMETHAMINE 30 MG/ML IJ SOLN
30.0000 mg | Freq: Once | INTRAMUSCULAR | Status: AC
  Administered 2013-02-16: 30 mg via INTRAVENOUS
  Filled 2013-02-16: qty 1

## 2013-02-16 MED ORDER — PROMETHAZINE HCL 25 MG/ML IJ SOLN
12.5000 mg | Freq: Once | INTRAMUSCULAR | Status: AC
  Administered 2013-02-16: 12.5 mg via INTRAVENOUS
  Filled 2013-02-16: qty 1

## 2013-02-16 MED ORDER — PROMETHAZINE HCL 25 MG/ML IJ SOLN
12.5000 mg | Freq: Once | INTRAMUSCULAR | Status: AC
  Administered 2013-02-16: 12.5 mg via INTRAVENOUS

## 2013-02-16 NOTE — ED Notes (Signed)
Vomiting x 2 days. Chest pain. States her lower back is real sore.

## 2013-02-16 NOTE — ED Provider Notes (Signed)
History     CSN: [id number]  Arrival date & time 02/16/13  1955   First MD Initiated Contact with Patient 02/16/13 2111      No chief complaint on file.   (Consider location/radiation/quality/duration/timing/severity/associated sxs/prior treatment) HPI Comments: Patient presents here with a two day history of "real bads pains in my chest".  She has been vomiting and has no apetite.  She also reports pain in her lower back.  No abd pain.  No fevers or chills.    Patient is a 32 y.o. female presenting with chest pain. The history is provided by the patient.  Chest Pain Pain location:  Substernal area Pain quality: stabbing   Pain radiates to:  Does not radiate Pain radiates to the back: no   Pain severity:  Moderate Onset quality:  Sudden Duration:  3 days Timing:  Constant Progression:  Worsening Chronicity:  New Context: not breathing   Relieved by:  Nothing Worsened by:  Nothing tried Ineffective treatments:  None tried Associated symptoms: abdominal pain     Past Medical History  Diagnosis Date  . Crohn disease   . Abdominal pain, unspecified site   . Hypertension   . Diabetes mellitus   . Pancreatitis   . Fibromyalgia   . Migraine   . CHF (congestive heart failure)   . Pulmonary embolism   . Chronic pain   . Polycystic ovarian disease   . Ulcer of ileum     Past Surgical History  Procedure Laterality Date  . Tonsillectomy    . Breast surgery      cyst  . Cholecystectomy      No family history on file.  History  Substance Use Topics  . Smoking status: Never Smoker   . Smokeless tobacco: Never Used  . Alcohol Use: No    OB History   Grav Para Term Preterm Abortions TAB SAB Ect Mult Living                  Review of Systems  Cardiovascular: Positive for chest pain.  Gastrointestinal: Positive for abdominal pain.  All other systems reviewed and are negative.    Allergies  Caffeine; Compazine; Decadron; Flagyl; Metoclopramide hcl; and  Zofran  Home Medications   Current Outpatient Rx  Name  Route  Sig  Dispense  Refill  . acetaminophen (TYLENOL) 500 MG tablet   Oral   Take 1,000 mg by mouth every 6 (six) hours as needed. For pain         . carvedilol (COREG) 25 MG tablet   Oral   Take 25 mg by mouth 2 (two) times daily with a meal.         . cloNIDine (CATAPRES) 0.1 MG tablet   Oral   Take 0.1 mg by mouth daily as needed. For high blood pressure         . diazepam (VALIUM) 5 MG tablet   Oral   Take 5 mg by mouth 2 (two) times daily as needed. For anxiety or back pain         . ibuprofen (ADVIL,MOTRIN) 200 MG tablet   Oral   Take 400 mg by mouth every 6 (six) hours as needed.         . lactulose (CEPHULAC) 10 G packet   Oral   Take 10 g by mouth 3 (three) times daily.         . mesalamine (ASACOL) 400 MG EC tablet   Oral  Take 800 mg by mouth daily.         . Olmesartan-Amlodipine-HCTZ (TRIBENZOR) 40-5-25 MG TABS   Oral   Take 1 tablet by mouth daily.           . Pediatric Multiple Vitamins (CHEWABLE MULTIPLE VITAMINS PO)   Oral   Take 1 tablet by mouth daily.         Marland Kitchen EXPIRED: promethazine (PHENERGAN) 25 MG suppository   Rectal   Place 1 suppository (25 mg total) rectally every 6 (six) hours as needed for nausea.   4 each   0   . EXPIRED: promethazine (PHENERGAN) 25 MG tablet   Oral   Take 1 tablet (25 mg total) by mouth every 6 (six) hours as needed for nausea.   10 tablet   0   . EXPIRED: promethazine (PHENERGAN) 25 MG tablet   Oral   Take 1 tablet (25 mg total) by mouth every 6 (six) hours as needed for nausea.   12 tablet   0   . EXPIRED: promethazine (PHENERGAN) 25 MG tablet   Oral   Take 1 tablet (25 mg total) by mouth every 6 (six) hours as needed for nausea.   15 tablet   0   . EXPIRED: promethazine (PHENERGAN) 25 MG tablet   Oral   Take 1 tablet (25 mg total) by mouth every 6 (six) hours as needed for nausea.   30 tablet   0   . Vitamin D,  Ergocalciferol, (DRISDOL) 50000 UNITS CAPS   Oral   Take 50,000 Units by mouth every 7 (seven) days. On Tuesday           BP 150/102  Pulse 91  Temp(Src) 98.1 F (36.7 C) (Oral)  Resp 18  SpO2 99%  Physical Exam  Nursing note and vitals reviewed. Constitutional: She is oriented to person, place, and time. She appears well-developed and well-nourished. No distress.  HENT:  Head: Normocephalic and atraumatic.  Neck: Normal range of motion. Neck supple.  Cardiovascular: Normal rate and regular rhythm.  Exam reveals no gallop and no friction rub.   No murmur heard. Pulmonary/Chest: Effort normal and breath sounds normal. No respiratory distress. She has no wheezes.  Abdominal: Soft. Bowel sounds are normal. She exhibits no distension. There is no tenderness.  Musculoskeletal: Normal range of motion.  There is mild ttp in the soft tissues of the lumbar region.  There is no bony ttp or stepoffs.  Strength is 5/5 in the ble.  Ambulatory without difficulty.  Neurological: She is alert and oriented to person, place, and time.  Skin: Skin is warm and dry. She is not diaphoretic.    ED Course  Procedures (including critical care time)  Labs Reviewed  CBC WITH DIFFERENTIAL  COMPREHENSIVE METABOLIC PANEL  LIPASE, BLOOD   No results found.   No diagnosis found.   Date: 02/16/2013  Rate: 90  Rhythm: normal sinus rhythm  QRS Axis: normal  Intervals: normal  ST/T Wave abnormalities: normal  Conduction Disutrbances:none  Narrative Interpretation:   Old EKG Reviewed: unchanged     MDM  The patient presents here with abdominal pain, nausea, and vomiting.  This seems like gastroenteritis.  She was hydrated and given anti-emetics and will be discharged with the same.  To return prn.        Geoffery Lyons, MD 02/16/13 [phone]

## 2013-05-15 ENCOUNTER — Encounter (HOSPITAL_BASED_OUTPATIENT_CLINIC_OR_DEPARTMENT_OTHER): Payer: Self-pay | Admitting: *Deleted

## 2013-05-15 ENCOUNTER — Emergency Department (HOSPITAL_BASED_OUTPATIENT_CLINIC_OR_DEPARTMENT_OTHER): Payer: Medicare Other

## 2013-05-15 ENCOUNTER — Emergency Department (HOSPITAL_BASED_OUTPATIENT_CLINIC_OR_DEPARTMENT_OTHER)
Admission: EM | Admit: 2013-05-15 | Discharge: 2013-05-15 | Disposition: A | Discharge status: Home / Self Care | Payer: Medicare Other | Attending: Emergency Medicine | Admitting: Emergency Medicine

## 2013-05-15 DIAGNOSIS — Z8719 Personal history of other diseases of the digestive system: Secondary | ICD-10-CM | POA: Insufficient documentation

## 2013-05-15 DIAGNOSIS — I509 Heart failure, unspecified: Secondary | ICD-10-CM | POA: Insufficient documentation

## 2013-05-15 DIAGNOSIS — Z791 Long term (current) use of non-steroidal anti-inflammatories (NSAID): Secondary | ICD-10-CM | POA: Insufficient documentation

## 2013-05-15 DIAGNOSIS — K529 Noninfective gastroenteritis and colitis, unspecified: Secondary | ICD-10-CM

## 2013-05-15 DIAGNOSIS — Z87891 Personal history of nicotine dependence: Secondary | ICD-10-CM | POA: Insufficient documentation

## 2013-05-15 DIAGNOSIS — K5289 Other specified noninfective gastroenteritis and colitis: Secondary | ICD-10-CM | POA: Insufficient documentation

## 2013-05-15 DIAGNOSIS — Z79899 Other long term (current) drug therapy: Secondary | ICD-10-CM | POA: Insufficient documentation

## 2013-05-15 DIAGNOSIS — Z8679 Personal history of other diseases of the circulatory system: Secondary | ICD-10-CM | POA: Insufficient documentation

## 2013-05-15 DIAGNOSIS — Z8739 Personal history of other diseases of the musculoskeletal system and connective tissue: Secondary | ICD-10-CM | POA: Insufficient documentation

## 2013-05-15 DIAGNOSIS — R109 Unspecified abdominal pain: Secondary | ICD-10-CM

## 2013-05-15 DIAGNOSIS — Z7901 Long term (current) use of anticoagulants: Secondary | ICD-10-CM | POA: Insufficient documentation

## 2013-05-15 DIAGNOSIS — Z86711 Personal history of pulmonary embolism: Secondary | ICD-10-CM | POA: Insufficient documentation

## 2013-05-15 DIAGNOSIS — G8929 Other chronic pain: Secondary | ICD-10-CM | POA: Insufficient documentation

## 2013-05-15 DIAGNOSIS — Z8742 Personal history of other diseases of the female genital tract: Secondary | ICD-10-CM | POA: Insufficient documentation

## 2013-05-15 LAB — CBC WITH DIFFERENTIAL/PLATELET
Basophils Relative: 0 % (ref 0–1)
Eosinophils Absolute: 0.3 10*3/uL (ref 0.0–0.7)
Eosinophils Relative: 3 % (ref 0–5)
HCT: 34.8 % — ABNORMAL LOW (ref 36.0–46.0)
Hemoglobin: 11.9 g/dL — ABNORMAL LOW (ref 12.0–15.0)
MCH: 26.2 pg (ref 26.0–34.0)
MCHC: 34.2 g/dL (ref 30.0–36.0)
Monocytes Absolute: 0.9 10*3/uL (ref 0.1–1.0)
Monocytes Relative: 8 % (ref 3–12)

## 2013-05-15 LAB — URINALYSIS, ROUTINE W REFLEX MICROSCOPIC
Bilirubin Urine: NEGATIVE
Hgb urine dipstick: NEGATIVE
Protein, ur: NEGATIVE mg/dL
Urobilinogen, UA: 1 mg/dL (ref 0.0–1.0)

## 2013-05-15 LAB — COMPREHENSIVE METABOLIC PANEL
Albumin: 4.1 g/dL (ref 3.5–5.2)
BUN: 8 mg/dL (ref 6–23)
Creatinine, Ser: 0.7 mg/dL (ref 0.50–1.10)
Total Bilirubin: 0.2 mg/dL — ABNORMAL LOW (ref 0.3–1.2)
Total Protein: 8.7 g/dL — ABNORMAL HIGH (ref 6.0–8.3)

## 2013-05-15 LAB — APTT: aPTT: 35 seconds (ref 24–37)

## 2013-05-15 LAB — URINE MICROSCOPIC-ADD ON

## 2013-05-15 LAB — PROTIME-INR: Prothrombin Time: 13.5 seconds (ref 11.6–15.2)

## 2013-05-15 MED ORDER — PROMETHAZINE HCL 25 MG PO TABS: 25.0000 mg | ORAL_TABLET | Freq: Four times a day (QID) | ORAL | Status: AC | PRN

## 2013-05-15 MED ORDER — IOHEXOL 300 MG/ML  SOLN
100.0000 mL | Freq: Once | INTRAMUSCULAR | Status: AC | PRN
  Administered 2013-05-15: 100 mL via INTRAVENOUS

## 2013-05-15 MED ORDER — HYDROCODONE-ACETAMINOPHEN 5-325 MG PO TABS: 2.0000 | ORAL_TABLET | ORAL | Status: AC | PRN

## 2013-05-15 MED ORDER — PROMETHAZINE HCL 25 MG/ML IJ SOLN
12.5000 mg | Freq: Once | INTRAMUSCULAR | Status: AC
  Administered 2013-05-15: 12.5 mg via INTRAVENOUS
  Filled 2013-05-15: qty 1

## 2013-05-15 MED ORDER — IOHEXOL 300 MG/ML  SOLN
50.0000 mL | Freq: Once | INTRAMUSCULAR | Status: AC | PRN
  Administered 2013-05-15: 50 mL via ORAL

## 2013-05-15 MED ORDER — MORPHINE SULFATE 4 MG/ML IJ SOLN
4.0000 mg | Freq: Once | INTRAMUSCULAR | Status: AC
  Administered 2013-05-15: 4 mg via INTRAVENOUS
  Filled 2013-05-15: qty 1

## 2013-05-15 MED ORDER — SODIUM CHLORIDE 0.9 % IV BOLUS (SEPSIS)
1000.0000 mL | Freq: Once | INTRAVENOUS | Status: AC
  Administered 2013-05-15: 1000 mL via INTRAVENOUS

## 2013-05-15 MED ORDER — KETOROLAC TROMETHAMINE 30 MG/ML IJ SOLN
30.0000 mg | Freq: Once | INTRAMUSCULAR | Status: AC
  Administered 2013-05-15: 30 mg via INTRAVENOUS
  Filled 2013-05-15: qty 1

## 2013-05-15 NOTE — ED Provider Notes (Signed)
History     CSN: [id number]  Arrival date & time 05/15/13  1201   First MD Initiated Contact with Patient 05/15/13 1215      Chief Complaint  Patient presents with  . Abdominal Pain    (Consider location/radiation/quality/duration/timing/severity/associated sxs/prior treatment) HPI Comments: Patient with a two day history of n/v/d that is worsening.  She now reports vomiting blood and seeing bright red blood in her stool.  She reports severe generalized abd pain.  She reports fever at home this morning for which she took tylenol.  Patient is a 32 y.o. female presenting with abdominal pain. The history is provided by the patient.  Abdominal Pain This is a new problem. The current episode started 2 days ago. The problem occurs constantly. The problem has been gradually worsening. Associated symptoms include abdominal pain. Nothing aggravates the symptoms. Nothing relieves the symptoms. She has tried nothing for the symptoms. The treatment provided no relief.    Past Medical History  Diagnosis Date  . Crohn disease   . Abdominal pain, unspecified site   . Hypertension   . Diabetes mellitus   . Pancreatitis   . Fibromyalgia   . Migraine   . CHF (congestive heart failure)   . Pulmonary embolism   . Chronic pain   . Polycystic ovarian disease   . Ulcer of ileum     Past Surgical History  Procedure Laterality Date  . Tonsillectomy    . Breast surgery      cyst  . Cholecystectomy      No family history on file.  History  Substance Use Topics  . Smoking status: Never Smoker   . Smokeless tobacco: Never Used  . Alcohol Use: No    OB History   Grav Para Term Preterm Abortions TAB SAB Ect Mult Living                  Review of Systems  Gastrointestinal: Positive for abdominal pain.  All other systems reviewed and are negative.    Allergies  Caffeine; Compazine; Decadron; Flagyl; Metoclopramide hcl; and Zofran  Home Medications   Current Outpatient Rx  Name   Route  Sig  Dispense  Refill  . acetaminophen (TYLENOL) 500 MG tablet   Oral   Take 1,000 mg by mouth every 6 (six) hours as needed. For pain         . carvedilol (COREG) 25 MG tablet   Oral   Take 25 mg by mouth 2 (two) times daily with a meal.         . cloNIDine (CATAPRES) 0.1 MG tablet   Oral   Take 0.1 mg by mouth daily as needed. For high blood pressure         . diazepam (VALIUM) 5 MG tablet   Oral   Take 5 mg by mouth 2 (two) times daily as needed. For anxiety or back pain         . ibuprofen (ADVIL,MOTRIN) 200 MG tablet   Oral   Take 400 mg by mouth every 6 (six) hours as needed.         . lactulose (CEPHULAC) 10 G packet   Oral   Take 10 g by mouth 3 (three) times daily.         . mesalamine (ASACOL) 400 MG EC tablet   Oral   Take 800 mg by mouth daily.         . Olmesartan-Amlodipine-HCTZ (TRIBENZOR) 40-5-25 MG TABS  Oral   Take 1 tablet by mouth daily.           . Pediatric Multiple Vitamins (CHEWABLE MULTIPLE VITAMINS PO)   Oral   Take 1 tablet by mouth daily.         Marland Kitchen EXPIRED: promethazine (PHENERGAN) 25 MG suppository   Rectal   Place 1 suppository (25 mg total) rectally every 6 (six) hours as needed for nausea.   4 each   0   . EXPIRED: promethazine (PHENERGAN) 25 MG tablet   Oral   Take 1 tablet (25 mg total) by mouth every 6 (six) hours as needed for nausea.   10 tablet   0   . EXPIRED: promethazine (PHENERGAN) 25 MG tablet   Oral   Take 1 tablet (25 mg total) by mouth every 6 (six) hours as needed for nausea.   12 tablet   0   . EXPIRED: promethazine (PHENERGAN) 25 MG tablet   Oral   Take 1 tablet (25 mg total) by mouth every 6 (six) hours as needed for nausea.   15 tablet   0   . EXPIRED: promethazine (PHENERGAN) 25 MG tablet   Oral   Take 1 tablet (25 mg total) by mouth every 6 (six) hours as needed for nausea.   30 tablet   0   . promethazine (PHENERGAN) 25 MG tablet   Oral   Take 1 tablet (25 mg total) by  mouth every 6 (six) hours as needed for nausea.   12 tablet   0   . Vitamin D, Ergocalciferol, (DRISDOL) 50000 UNITS CAPS   Oral   Take 50,000 Units by mouth every 7 (seven) days. On Tuesday           BP 126/84  Pulse 117  Temp(Src) 99.2 F (37.3 C) (Oral)  Resp 20  Wt 219 lb (99.338 kg)  BMI 35.36 kg/m2  SpO2 98%  Physical Exam  Nursing note and vitals reviewed. Constitutional: She is oriented to person, place, and time. She appears well-developed and well-nourished. No distress.  HENT:  Head: Normocephalic and atraumatic.  Mouth/Throat: Oropharynx is clear and moist.  Neck: Normal range of motion. Neck supple.  Cardiovascular: Normal rate, regular rhythm and normal heart sounds.   No murmur heard. Pulmonary/Chest: Effort normal and breath sounds normal. No respiratory distress. She has no wheezes.  Abdominal: Soft. Bowel sounds are normal. She exhibits no distension. There is no rebound and no guarding.  There is ttp in all four quadrants without rebound or guarding.    Musculoskeletal: Normal range of motion. She exhibits no edema.  Neurological: She is alert and oriented to person, place, and time.  Skin: Skin is warm and dry. She is not diaphoretic.    ED Course  Procedures (including critical care time)  Labs Reviewed  URINALYSIS, ROUTINE W REFLEX MICROSCOPIC  PREGNANCY, URINE  OCCULT BLOOD X 1 CARD TO LAB, STOOL  CBC WITH DIFFERENTIAL  COMPREHENSIVE METABOLIC PANEL   No results found.   No diagnosis found.    MDM  The ct is negative and the labs are unremarkable with the exception of a mild wbc.  The stool is heme negative and the hemoccult test is negative for blood.  I doubt any emergent pathology and suspect a viral etiology.  She feels somewhat better with meds and fluids in the ED.  Will treat with phenergan and norco.           Geoffery Lyons, MD 05/15/13 1419

## 2013-05-15 NOTE — ED Notes (Signed)
2 days of vomiting and diarrhea. States she has seen bright red blood in both.

## 2013-05-16 LAB — URINE CULTURE: Culture: NO GROWTH

## 2013-06-21 ENCOUNTER — Emergency Department (HOSPITAL_BASED_OUTPATIENT_CLINIC_OR_DEPARTMENT_OTHER)
Admission: EM | Admit: 2013-06-21 | Discharge: 2013-06-21 | Disposition: A | Discharge status: Home / Self Care | Payer: Medicare Other | Attending: Emergency Medicine | Admitting: Emergency Medicine

## 2013-06-21 ENCOUNTER — Encounter (HOSPITAL_BASED_OUTPATIENT_CLINIC_OR_DEPARTMENT_OTHER): Payer: Self-pay

## 2013-06-21 DIAGNOSIS — Z86711 Personal history of pulmonary embolism: Secondary | ICD-10-CM | POA: Insufficient documentation

## 2013-06-21 DIAGNOSIS — E119 Type 2 diabetes mellitus without complications: Secondary | ICD-10-CM | POA: Insufficient documentation

## 2013-06-21 DIAGNOSIS — G8929 Other chronic pain: Secondary | ICD-10-CM | POA: Insufficient documentation

## 2013-06-21 DIAGNOSIS — R1013 Epigastric pain: Secondary | ICD-10-CM | POA: Insufficient documentation

## 2013-06-21 DIAGNOSIS — Z9089 Acquired absence of other organs: Secondary | ICD-10-CM | POA: Insufficient documentation

## 2013-06-21 DIAGNOSIS — R51 Headache: Secondary | ICD-10-CM | POA: Insufficient documentation

## 2013-06-21 DIAGNOSIS — Z3202 Encounter for pregnancy test, result negative: Secondary | ICD-10-CM | POA: Insufficient documentation

## 2013-06-21 DIAGNOSIS — I10 Essential (primary) hypertension: Secondary | ICD-10-CM | POA: Insufficient documentation

## 2013-06-21 DIAGNOSIS — I509 Heart failure, unspecified: Secondary | ICD-10-CM | POA: Insufficient documentation

## 2013-06-21 DIAGNOSIS — Z79899 Other long term (current) drug therapy: Secondary | ICD-10-CM | POA: Insufficient documentation

## 2013-06-21 DIAGNOSIS — IMO0001 Reserved for inherently not codable concepts without codable children: Secondary | ICD-10-CM | POA: Insufficient documentation

## 2013-06-21 DIAGNOSIS — R112 Nausea with vomiting, unspecified: Secondary | ICD-10-CM | POA: Insufficient documentation

## 2013-06-21 DIAGNOSIS — G43909 Migraine, unspecified, not intractable, without status migrainosus: Secondary | ICD-10-CM | POA: Insufficient documentation

## 2013-06-21 DIAGNOSIS — R109 Unspecified abdominal pain: Secondary | ICD-10-CM

## 2013-06-21 DIAGNOSIS — R1110 Vomiting, unspecified: Secondary | ICD-10-CM

## 2013-06-21 DIAGNOSIS — Z8742 Personal history of other diseases of the female genital tract: Secondary | ICD-10-CM | POA: Insufficient documentation

## 2013-06-21 DIAGNOSIS — Z8719 Personal history of other diseases of the digestive system: Secondary | ICD-10-CM | POA: Insufficient documentation

## 2013-06-21 HISTORY — DX: Gastric ulcer, unspecified as acute or chronic, without hemorrhage or perforation: K25.9

## 2013-06-21 LAB — URINALYSIS, ROUTINE W REFLEX MICROSCOPIC
Bilirubin Urine: NEGATIVE
Ketones, ur: NEGATIVE mg/dL
Nitrite: NEGATIVE
Protein, ur: NEGATIVE mg/dL
Urobilinogen, UA: 0.2 mg/dL (ref 0.0–1.0)

## 2013-06-21 LAB — COMPREHENSIVE METABOLIC PANEL
AST: 18 U/L (ref 0–37)
BUN: 7 mg/dL (ref 6–23)
CO2: 25 mEq/L (ref 19–32)
Chloride: 98 mEq/L (ref 96–112)
Creatinine, Ser: 0.7 mg/dL (ref 0.50–1.10)
GFR calc non Af Amer: >90 mL/min (ref >90)
Total Bilirubin: 0.2 mg/dL — ABNORMAL LOW (ref 0.3–1.2)

## 2013-06-21 LAB — CBC
HCT: 33.4 % — ABNORMAL LOW (ref 36.0–46.0)
MCV: 77.1 fL — ABNORMAL LOW (ref 78.0–100.0)
RBC: 4.33 MIL/uL (ref 3.87–5.11)
WBC: 8.5 10*3/uL (ref 4.0–10.5)

## 2013-06-21 MED ORDER — PROMETHAZINE HCL 25 MG/ML IJ SOLN
25.0000 mg | Freq: Once | INTRAMUSCULAR | Status: AC
  Administered 2013-06-21: 25 mg via INTRAVENOUS
  Filled 2013-06-21: qty 1

## 2013-06-21 MED ORDER — KETOROLAC TROMETHAMINE 30 MG/ML IJ SOLN
30.0000 mg | Freq: Once | INTRAMUSCULAR | Status: AC
  Administered 2013-06-21: 30 mg via INTRAVENOUS
  Filled 2013-06-21: qty 1

## 2013-06-21 MED ORDER — DIAZEPAM 5 MG/ML IJ SOLN
5.0000 mg | Freq: Once | INTRAMUSCULAR | Status: AC
  Administered 2013-06-21: 5 mg via INTRAVENOUS
  Filled 2013-06-21: qty 2

## 2013-06-21 MED ORDER — SODIUM CHLORIDE 0.9 % IV BOLUS (SEPSIS)
1000.0000 mL | Freq: Once | INTRAVENOUS | Status: AC
  Administered 2013-06-21: 1000 mL via INTRAVENOUS

## 2013-06-21 MED ORDER — DIPHENHYDRAMINE HCL 50 MG/ML IJ SOLN
25.0000 mg | Freq: Once | INTRAMUSCULAR | Status: AC
  Administered 2013-06-21: 25 mg via INTRAVENOUS
  Filled 2013-06-21: qty 1

## 2013-06-21 NOTE — ED Notes (Signed)
Pt reports a 1 week history of abdominal pain, vomiting and a migraine.  States she had an EGD recently that showed gastritis and an ulcer.  She stopped taking Protonix.

## 2013-06-21 NOTE — ED Provider Notes (Signed)
History    CSN: [id number] Arrival date & time 06/21/13  1205  First MD Initiated Contact with Patient 06/21/13 1228     Chief Complaint  Patient presents with  . Abdominal Pain  . Emesis  . Migraine   (Consider location/radiation/quality/duration/timing/severity/associated sxs/prior Treatment) HPI Comments: Pt states that she was recently diagnosed with an ulcer and put on protonix:pt states that she has gastritis as well:pt state that when she vomits she gets some blood in her vomit which is not new:pt states that she also had had a migraine for  A week which is similar to previous migraines:pt states that she saw her pcp for that but the medications didn't help:pt state that she has been seen intermittently at [facility]  Patient is a 32 y.o. female presenting with abdominal pain. The history is provided by the patient. No language interpreter was used.  Abdominal Pain This is a chronic problem. The current episode started in the past 7 days. The problem occurs constantly. The problem has been unchanged. Associated symptoms include abdominal pain, nausea and vomiting. Pertinent negatives include no fever. Nothing aggravates the symptoms. She has tried nothing for the symptoms.   Past Medical History  Diagnosis Date  . Crohn disease   . Abdominal pain, unspecified site   . Hypertension   . Diabetes mellitus   . Pancreatitis   . Fibromyalgia   . Migraine   . CHF (congestive heart failure)   . Pulmonary embolism   . Chronic pain   . Polycystic ovarian disease   . Ulcer of ileum   . Gastric ulcer    Past Surgical History  Procedure Laterality Date  . Tonsillectomy    . Breast surgery      cyst  . Cholecystectomy     No family history on file. History  Substance Use Topics  . Smoking status: Never Smoker   . Smokeless tobacco: Never Used  . Alcohol Use: No   OB History   Grav Para Term Preterm Abortions TAB SAB Ect Mult Living                 Review of Systems   Constitutional: Negative for fever.  Respiratory: Negative.   Cardiovascular: Negative.   Gastrointestinal: Positive for nausea, vomiting and abdominal pain.    Allergies  Caffeine; Compazine; Decadron; Flagyl; Metoclopramide hcl; and Zofran  Home Medications   Current Outpatient Rx  Name  Route  Sig  Dispense  Refill  . acetaminophen (TYLENOL) 500 MG tablet   Oral   Take 1,000 mg by mouth every 6 (six) hours as needed. For pain         . carvedilol (COREG) 25 MG tablet   Oral   Take 25 mg by mouth 2 (two) times daily with a meal.         . cloNIDine (CATAPRES) 0.1 MG tablet   Oral   Take 0.1 mg by mouth daily as needed. For high blood pressure         . diazepam (VALIUM) 5 MG tablet   Oral   Take 5 mg by mouth 2 (two) times daily as needed. For anxiety or back pain         . HYDROcodone-acetaminophen (NORCO) 5-325 MG per tablet   Oral   Take 2 tablets by mouth every 4 (four) hours as needed for pain.   12 tablet   0   . ibuprofen (ADVIL,MOTRIN) 200 MG tablet   Oral  Take 400 mg by mouth every 6 (six) hours as needed.         . lactulose (CEPHULAC) 10 G packet   Oral   Take 10 g by mouth 3 (three) times daily.         . mesalamine (ASACOL) 400 MG EC tablet   Oral   Take 800 mg by mouth daily.         . Olmesartan-Amlodipine-HCTZ (TRIBENZOR) 40-5-25 MG TABS   Oral   Take 1 tablet by mouth daily.           . Pediatric Multiple Vitamins (CHEWABLE MULTIPLE VITAMINS PO)   Oral   Take 1 tablet by mouth daily.         . promethazine (PHENERGAN) 25 MG tablet   Oral   Take 1 tablet (25 mg total) by mouth every 6 (six) hours as needed for nausea.   12 tablet   0   . Vitamin D, Ergocalciferol, (DRISDOL) 50000 UNITS CAPS   Oral   Take 50,000 Units by mouth every 7 (seven) days. On Tuesday          BP 122/83  Pulse 94  Temp(Src) 98.4 F (36.9 C) (Oral)  Resp 18  Ht 5\' 6"  (1.676 m)  Wt 208 lb 6 oz (94.518 kg)  BMI 33.65 kg/m2  SpO2  100%  LMP 06/21/2013 Physical Exam  Nursing note and vitals reviewed. Constitutional: She is oriented to person, place, and time. She appears well-developed and well-nourished.  HENT:  Head: Normocephalic and atraumatic.  Eyes: Conjunctivae and EOM are normal.  Neck: Normal range of motion.  Cardiovascular: Normal rate and regular rhythm.   Pulmonary/Chest: Effort normal and breath sounds normal.  Abdominal: Soft. Bowel sounds are normal. There is tenderness in the epigastric area.  Musculoskeletal: Normal range of motion.  Neurological: She is alert and oriented to person, place, and time. Coordination normal.  Skin: Skin is warm and dry.    ED Course  Procedures (including critical care time) Labs Reviewed  URINALYSIS, ROUTINE W REFLEX MICROSCOPIC - Abnormal; Notable for the following:    APPearance CLOUDY (*)    Hgb urine dipstick LARGE (*)    Leukocytes, UA SMALL (*)    All other components within normal limits  URINE MICROSCOPIC-ADD ON - Abnormal; Notable for the following:    Squamous Epithelial / LPF FEW (*)    Bacteria, UA FEW (*)    All other components within normal limits  CBC - Abnormal; Notable for the following:    Hemoglobin 11.4 (*)    HCT 33.4 (*)    MCV 77.1 (*)    RDW 15.7 (*)    All other components within normal limits  COMPREHENSIVE METABOLIC PANEL - Abnormal; Notable for the following:    Glucose, Bld 171 (*)    Total Protein 8.5 (*)    Total Bilirubin 0.2 (*)    All other components within normal limits  PREGNANCY, URINE   No results found. 1. Abdominal pain   2. Vomiting   3. Headache     MDM  Pt no vomiting at this time,but doesn't want to try po:pt is here a lot of similar symptoms:don't think imaging is needed at this time  Teressa Lower, NP 06/21/13 1529

## 2013-06-22 NOTE — ED Provider Notes (Signed)
Medical screening examination/treatment/procedure(s) were performed by non-physician practitioner and as supervising physician I was immediately available for consultation/collaboration.   Linas Stepter B. Bernette Mayers, MD 06/22/13 [phone]

## 2013-08-20 IMAGING — CT CT ANGIO CHEST
2 of 6 series · 19 of 36 positions shown · IV contrast (APPLIED)
Comparison: Chest x-ray of 09/03/2011 and CT chest of 09/09/2009

CLINICAL DATA: Chest pain, leg pain, generalized body weakness,
Crohn's disease

CT ANGIOGRAPHY CHEST WITH CONTRAST
TECHNIQUE: Multidetector CT imaging of the chest was performed
using the standard protocol during bolus administration of
intravenous contrast.  Multiplanar CT image reconstructions
including MIPs were obtained to evaluate the vascular anatomy.
Contrast: 80mL OMNIPAQUE IOHEXOL 350 MG/ML IV SOLN

[Series 5: pe 1.0 b25f · axial · 0.56mm/px · z∈[-223,+2]mm · 18 of 251 slices shown]
[im 13/251  lung]
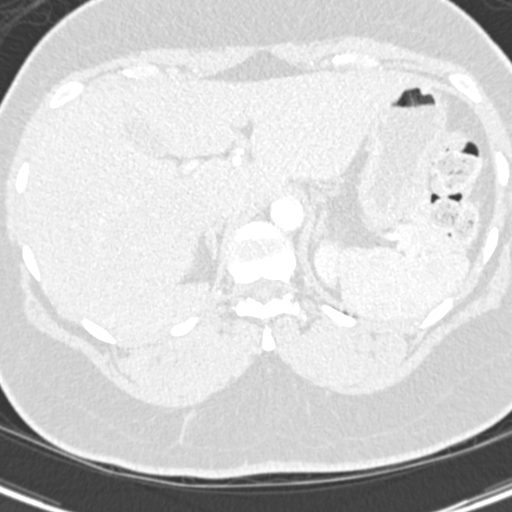
[im 26/251  mediastinal]
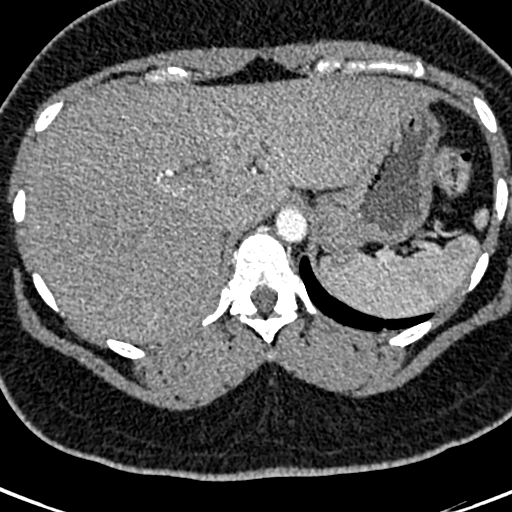
[im 38/251  lung]
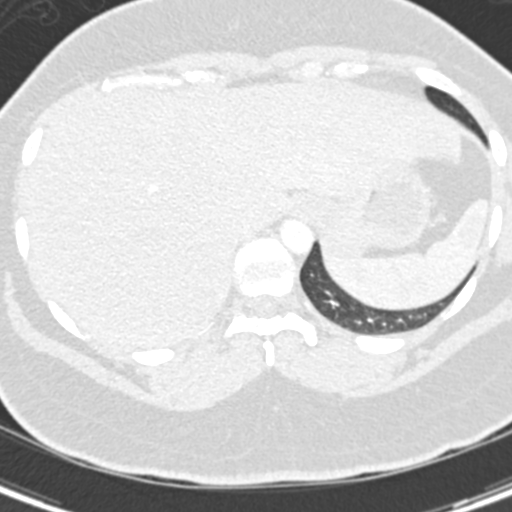
[im 51/251  mediastinal]
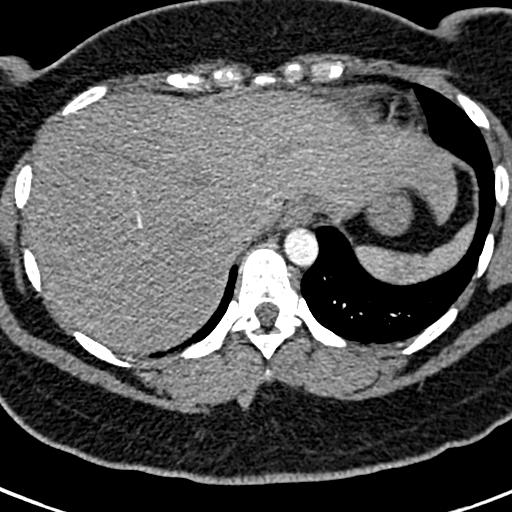
[im 63/251  lung]
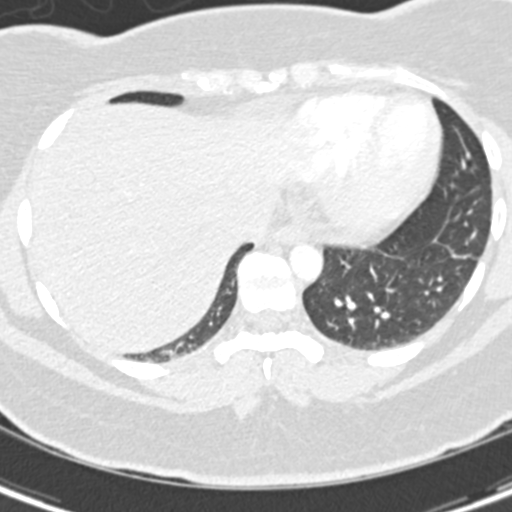
[im 76/251  mediastinal]
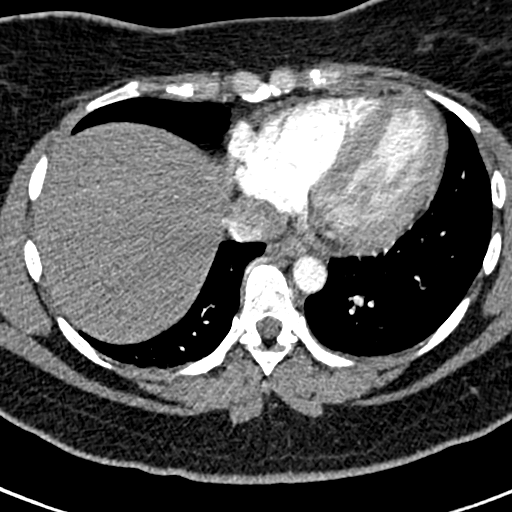
[im 88/251  lung]
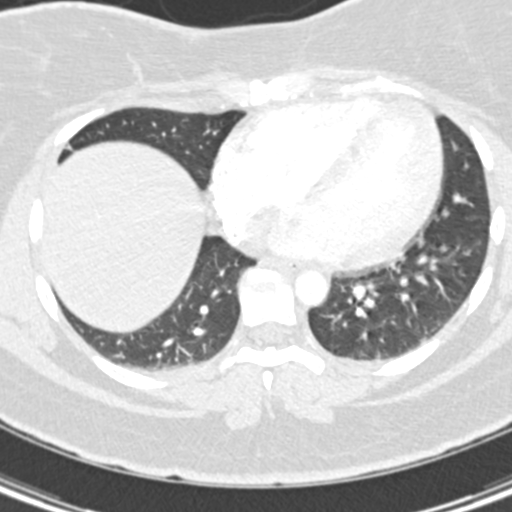
[im 101/251  mediastinal]
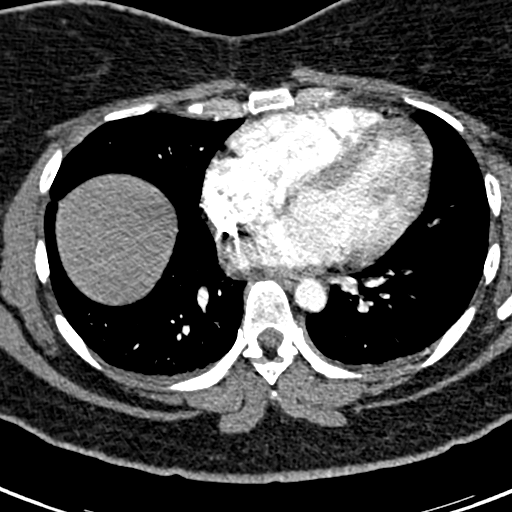
[im 113/251  lung]
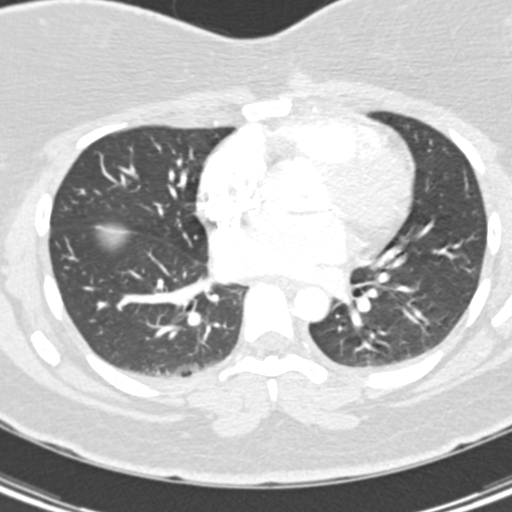
[im 138/251  mediastinal]
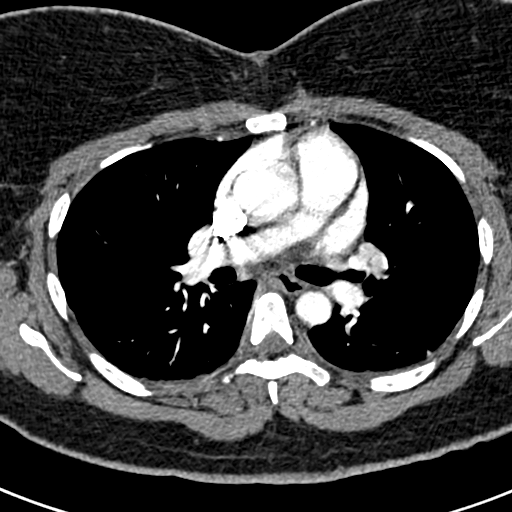
[im 151/251  lung]
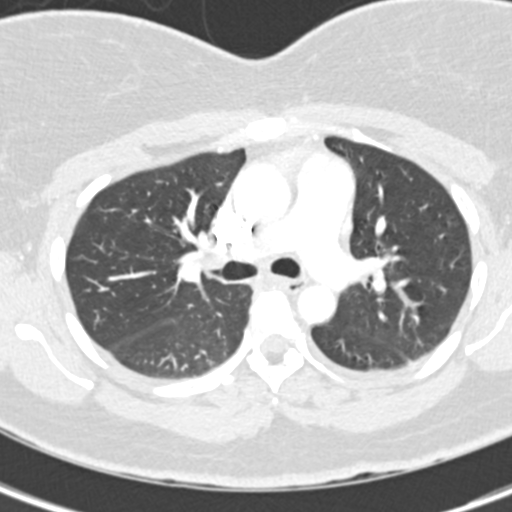
[im 163/251  mediastinal]
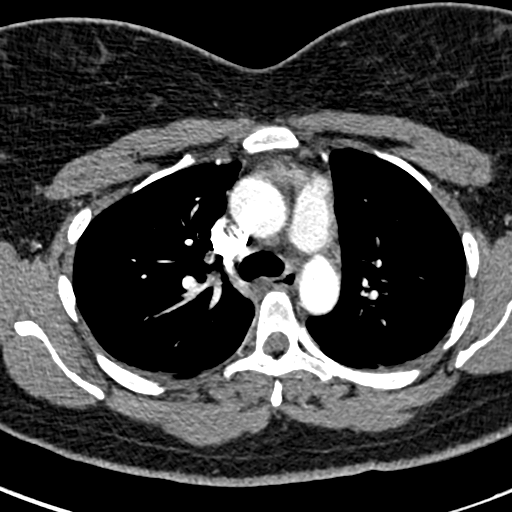
[im 176/251  lung]
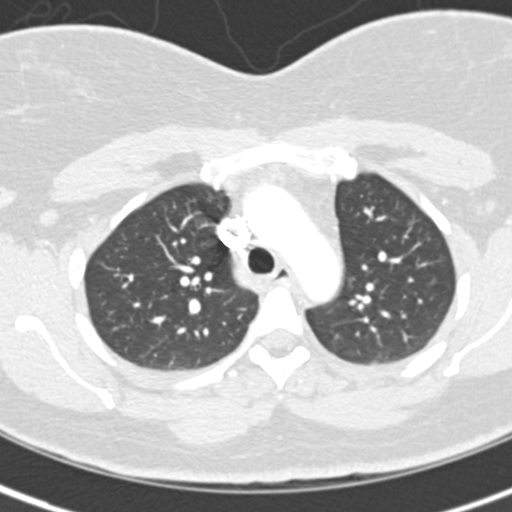
[im 188/251  mediastinal]
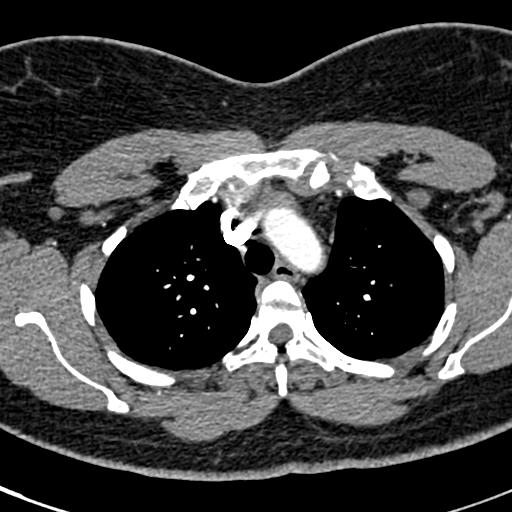
[im 201/251  lung]
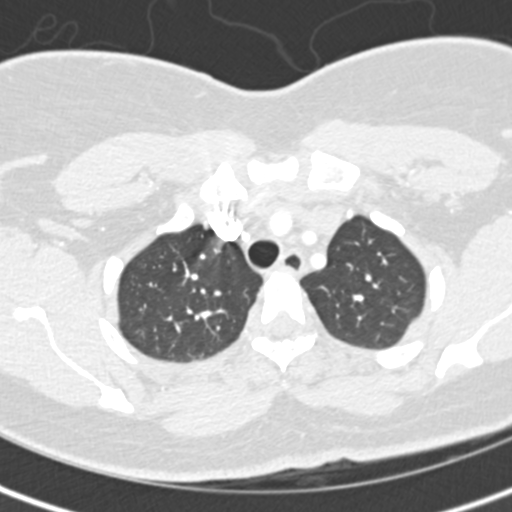
[im 213/251  mediastinal]
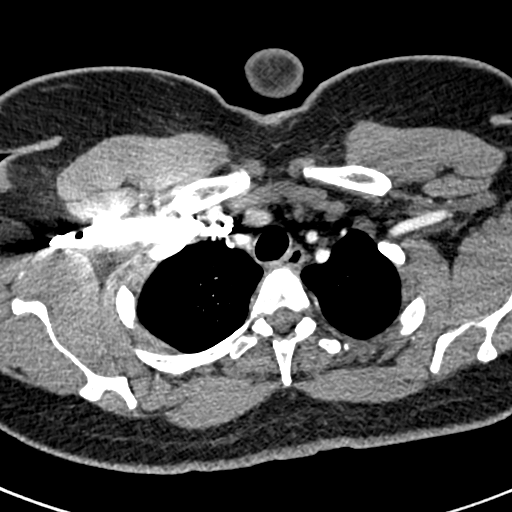
[im 226/251  lung]
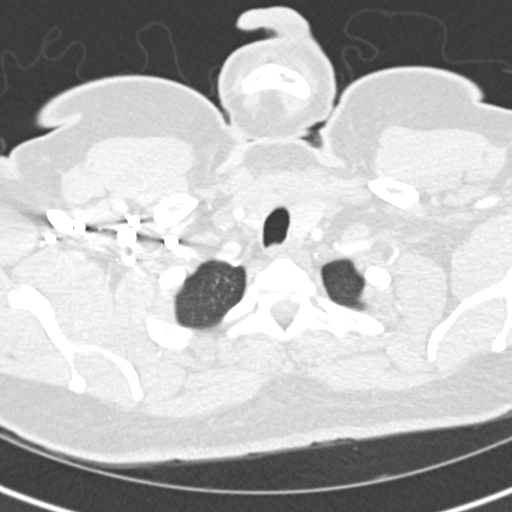
[im 238/251  mediastinal]
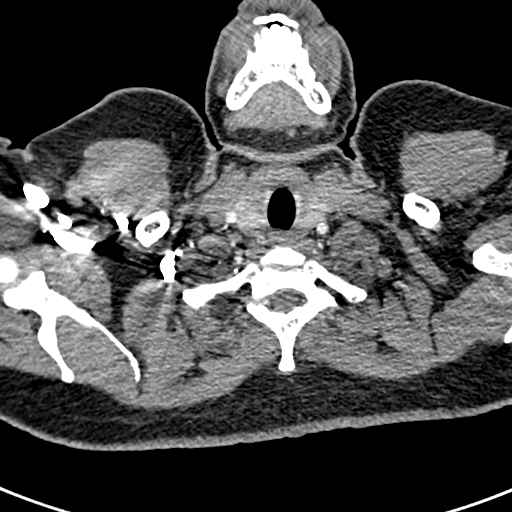

[Series 8: pe 2.0 coronal · coronal · 0.50mm/px · 1 of 119 slices shown]
[im 60/119  mediastinal]
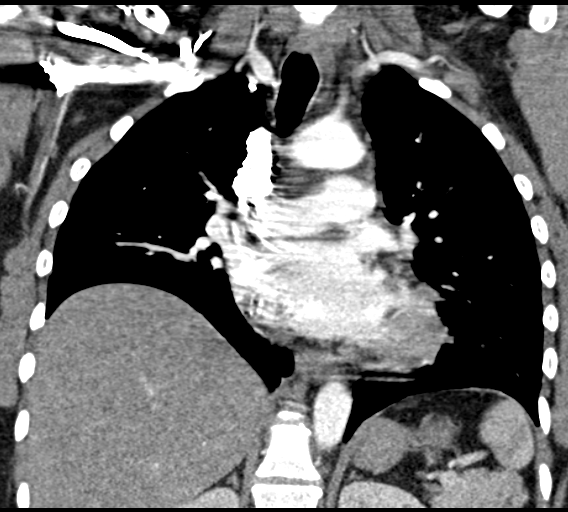

[19 of 36 positions shown; findings below may reference images not displayed]

FINDINGS: The pulmonary arteries are moderately well opacified.
There is no evidence of acute pulmonary embolism.  The thoracic
aorta opacifies with no acute abnormality noted.  The origins of
the great vessels are patent.  No mediastinal or hilar adenopathy
is seen.  The heart is within upper limits of normal.  No
abnormality of the upper abdomen is seen.  There does appear to be
asymmetric enlargement of the left lobe of thyroid.

On the lung window images, no lung parenchymal abnormality is seen.
No effusion is noted.  No bony abnormality is seen.

Review of the MIP images confirms the above findings.
IMPRESSION: 1.  No evidence of acute pulmonary embolism.
2.  No infiltrate, nodule, or pleural effusion is seen.
3.  Probable slight asymmetric enlargement of the left lobe
thyroid.

## 2014-01-24 ENCOUNTER — Encounter (HOSPITAL_BASED_OUTPATIENT_CLINIC_OR_DEPARTMENT_OTHER): Payer: Self-pay | Admitting: Emergency Medicine

## 2014-01-24 ENCOUNTER — Emergency Department (HOSPITAL_BASED_OUTPATIENT_CLINIC_OR_DEPARTMENT_OTHER)
Admission: EM | Admit: 2014-01-24 | Discharge: 2014-01-24 | Disposition: A | Discharge status: Home / Self Care | Payer: No Typology Code available for payment source | Attending: Emergency Medicine | Admitting: Emergency Medicine

## 2014-01-24 DIAGNOSIS — Y9389 Activity, other specified: Secondary | ICD-10-CM | POA: Insufficient documentation

## 2014-01-24 DIAGNOSIS — Z86711 Personal history of pulmonary embolism: Secondary | ICD-10-CM | POA: Insufficient documentation

## 2014-01-24 DIAGNOSIS — E119 Type 2 diabetes mellitus without complications: Secondary | ICD-10-CM | POA: Insufficient documentation

## 2014-01-24 DIAGNOSIS — S8990XA Unspecified injury of unspecified lower leg, initial encounter: Secondary | ICD-10-CM | POA: Insufficient documentation

## 2014-01-24 DIAGNOSIS — S0990XA Unspecified injury of head, initial encounter: Secondary | ICD-10-CM | POA: Insufficient documentation

## 2014-01-24 DIAGNOSIS — IMO0002 Reserved for concepts with insufficient information to code with codable children: Secondary | ICD-10-CM | POA: Insufficient documentation

## 2014-01-24 DIAGNOSIS — S99919A Unspecified injury of unspecified ankle, initial encounter: Secondary | ICD-10-CM

## 2014-01-24 DIAGNOSIS — G43909 Migraine, unspecified, not intractable, without status migrainosus: Secondary | ICD-10-CM | POA: Insufficient documentation

## 2014-01-24 DIAGNOSIS — I509 Heart failure, unspecified: Secondary | ICD-10-CM | POA: Insufficient documentation

## 2014-01-24 DIAGNOSIS — R519 Headache, unspecified: Secondary | ICD-10-CM

## 2014-01-24 DIAGNOSIS — Z8719 Personal history of other diseases of the digestive system: Secondary | ICD-10-CM | POA: Insufficient documentation

## 2014-01-24 DIAGNOSIS — R51 Headache: Secondary | ICD-10-CM

## 2014-01-24 DIAGNOSIS — Y92410 Unspecified street and highway as the place of occurrence of the external cause: Secondary | ICD-10-CM | POA: Insufficient documentation

## 2014-01-24 DIAGNOSIS — S99929A Unspecified injury of unspecified foot, initial encounter: Secondary | ICD-10-CM

## 2014-01-24 DIAGNOSIS — G8929 Other chronic pain: Secondary | ICD-10-CM | POA: Insufficient documentation

## 2014-01-24 DIAGNOSIS — M549 Dorsalgia, unspecified: Secondary | ICD-10-CM

## 2014-01-24 DIAGNOSIS — Z79899 Other long term (current) drug therapy: Secondary | ICD-10-CM | POA: Insufficient documentation

## 2014-01-24 DIAGNOSIS — I10 Essential (primary) hypertension: Secondary | ICD-10-CM | POA: Insufficient documentation

## 2014-01-24 MED ORDER — PROMETHAZINE HCL 25 MG PO TABS
25.0000 mg | ORAL_TABLET | Freq: Once | ORAL | Status: AC
  Administered 2014-01-24: 25 mg via ORAL
  Filled 2014-01-24: qty 1

## 2014-01-24 MED ORDER — HYDROCODONE-ACETAMINOPHEN 5-325 MG PO TABS
2.0000 | ORAL_TABLET | Freq: Once | ORAL | Status: AC
  Administered 2014-01-24: 2 via ORAL
  Filled 2014-01-24: qty 2

## 2014-01-24 MED ORDER — PROMETHAZINE HCL 25 MG PO TABS: 25.0000 mg | ORAL_TABLET | Freq: Four times a day (QID) | ORAL | Status: AC | PRN

## 2014-01-24 MED ORDER — HYDROCODONE-ACETAMINOPHEN 5-325 MG PO TABS: 2.0000 | ORAL_TABLET | ORAL | Status: AC | PRN
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
## 2014-01-24 NOTE — ED Provider Notes (Signed)
Medical screening examination/treatment/procedure(s) were performed by non-physician practitioner and as supervising physician I was immediately available for consultation/collaboration.  EKG Interpretation   None         Charles B. Sheldon, MD 01/24/14 2149 
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
## 2014-02-05 ENCOUNTER — Emergency Department (HOSPITAL_BASED_OUTPATIENT_CLINIC_OR_DEPARTMENT_OTHER)
Admission: EM | Admit: 2014-02-05 | Discharge: 2014-02-05 | Disposition: A | Discharge status: Home / Self Care | Payer: Medicare Other | Attending: Emergency Medicine | Admitting: Emergency Medicine

## 2014-02-05 ENCOUNTER — Encounter (HOSPITAL_BASED_OUTPATIENT_CLINIC_OR_DEPARTMENT_OTHER): Payer: Self-pay | Admitting: Emergency Medicine

## 2014-02-05 DIAGNOSIS — R300 Dysuria: Secondary | ICD-10-CM | POA: Insufficient documentation

## 2014-02-05 DIAGNOSIS — Z8719 Personal history of other diseases of the digestive system: Secondary | ICD-10-CM | POA: Insufficient documentation

## 2014-02-05 DIAGNOSIS — N898 Other specified noninflammatory disorders of vagina: Secondary | ICD-10-CM | POA: Insufficient documentation

## 2014-02-05 DIAGNOSIS — Z9889 Other specified postprocedural states: Secondary | ICD-10-CM | POA: Insufficient documentation

## 2014-02-05 DIAGNOSIS — Z79899 Other long term (current) drug therapy: Secondary | ICD-10-CM | POA: Insufficient documentation

## 2014-02-05 DIAGNOSIS — R1084 Generalized abdominal pain: Secondary | ICD-10-CM | POA: Insufficient documentation

## 2014-02-05 DIAGNOSIS — R110 Nausea: Secondary | ICD-10-CM | POA: Insufficient documentation

## 2014-02-05 DIAGNOSIS — I10 Essential (primary) hypertension: Secondary | ICD-10-CM | POA: Insufficient documentation

## 2014-02-05 DIAGNOSIS — I509 Heart failure, unspecified: Secondary | ICD-10-CM | POA: Insufficient documentation

## 2014-02-05 DIAGNOSIS — Z9089 Acquired absence of other organs: Secondary | ICD-10-CM | POA: Insufficient documentation

## 2014-02-05 DIAGNOSIS — G8929 Other chronic pain: Secondary | ICD-10-CM | POA: Insufficient documentation

## 2014-02-05 DIAGNOSIS — E119 Type 2 diabetes mellitus without complications: Secondary | ICD-10-CM | POA: Insufficient documentation

## 2014-02-05 DIAGNOSIS — Z3202 Encounter for pregnancy test, result negative: Secondary | ICD-10-CM | POA: Insufficient documentation

## 2014-02-05 DIAGNOSIS — R109 Unspecified abdominal pain: Secondary | ICD-10-CM

## 2014-02-05 DIAGNOSIS — Z86711 Personal history of pulmonary embolism: Secondary | ICD-10-CM | POA: Insufficient documentation

## 2014-02-05 LAB — WET PREP, GENITAL
CLUE CELLS WET PREP: NONE SEEN
Trich, Wet Prep: NONE SEEN
Yeast Wet Prep HPF POC: NONE SEEN

## 2014-02-05 LAB — URINALYSIS, ROUTINE W REFLEX MICROSCOPIC
Bilirubin Urine: NEGATIVE
Glucose, UA: NEGATIVE mg/dL
Hgb urine dipstick: NEGATIVE
KETONES UR: NEGATIVE mg/dL
LEUKOCYTES UA: NEGATIVE
NITRITE: NEGATIVE
PROTEIN: 30 mg/dL — AB
Specific Gravity, Urine: 1.017 (ref 1.005–1.030)
UROBILINOGEN UA: 0.2 mg/dL (ref 0.0–1.0)
pH: 6.5 (ref 5.0–8.0)

## 2014-02-05 LAB — COMPREHENSIVE METABOLIC PANEL
ALT: 14 U/L (ref 0–35)
AST: 22 U/L (ref 0–37)
Albumin: 4.2 g/dL (ref 3.5–5.2)
Alkaline Phosphatase: 104 U/L (ref 39–117)
BILIRUBIN TOTAL: 0.4 mg/dL (ref 0.3–1.2)
BUN: 8 mg/dL (ref 6–23)
CALCIUM: 10.1 mg/dL (ref 8.4–10.5)
CHLORIDE: 97 meq/L (ref 96–112)
CO2: 28 meq/L (ref 19–32)
CREATININE: 0.7 mg/dL (ref 0.50–1.10)
Glucose, Bld: 154 mg/dL — ABNORMAL HIGH (ref 70–99)
Potassium: 3.5 mEq/L — ABNORMAL LOW (ref 3.7–5.3)
Sodium: 138 mEq/L (ref 137–147)
Total Protein: 8.6 g/dL — ABNORMAL HIGH (ref 6.0–8.3)

## 2014-02-05 LAB — CBC WITH DIFFERENTIAL/PLATELET
Basophils Absolute: 0 10*3/uL (ref 0.0–0.1)
Basophils Relative: 0 % (ref 0–1)
EOS PCT: 2 % (ref 0–5)
Eosinophils Absolute: 0.2 10*3/uL (ref 0.0–0.7)
HEMATOCRIT: 33.1 % — AB (ref 36.0–46.0)
HEMOGLOBIN: 11 g/dL — AB (ref 12.0–15.0)
LYMPHS PCT: 26 % (ref 12–46)
Lymphs Abs: 2.3 10*3/uL (ref 0.7–4.0)
MCH: 25.6 pg — ABNORMAL LOW (ref 26.0–34.0)
MCHC: 33.2 g/dL (ref 30.0–36.0)
MCV: 77 fL — AB (ref 78.0–100.0)
MONO ABS: 0.6 10*3/uL (ref 0.1–1.0)
MONOS PCT: 7 % (ref 3–12)
NEUTROS ABS: 5.7 10*3/uL (ref 1.7–7.7)
Neutrophils Relative %: 65 % (ref 43–77)
Platelets: 319 10*3/uL (ref 150–400)
RBC: 4.3 MIL/uL (ref 3.87–5.11)
RDW: 15.9 % — ABNORMAL HIGH (ref 11.5–15.5)
WBC: 8.8 10*3/uL (ref 4.0–10.5)

## 2014-02-05 LAB — LIPASE, BLOOD: LIPASE: 23 U/L (ref 11–59)

## 2014-02-05 LAB — URINE MICROSCOPIC-ADD ON

## 2014-02-05 LAB — PREGNANCY, URINE: Preg Test, Ur: NEGATIVE

## 2014-02-05 MED ORDER — CEPHALEXIN 500 MG PO CAPS: 500.0000 mg | ORAL_CAPSULE | Freq: Four times a day (QID) | ORAL | Status: AC

## 2014-02-05 MED ORDER — SODIUM CHLORIDE 0.9 % IV BOLUS (SEPSIS)
1000.0000 mL | Freq: Once | INTRAVENOUS | Status: AC
  Administered 2014-02-05: 1000 mL via INTRAVENOUS

## 2014-02-05 MED ORDER — KETOROLAC TROMETHAMINE 30 MG/ML IJ SOLN
30.0000 mg | Freq: Once | INTRAMUSCULAR | Status: AC
  Administered 2014-02-05: 30 mg via INTRAVENOUS
  Filled 2014-02-05: qty 1

## 2014-02-05 MED ORDER — PROMETHAZINE HCL 25 MG PO TABS: 25.0000 mg | ORAL_TABLET | Freq: Four times a day (QID) | ORAL | Status: AC | PRN

## 2014-02-05 MED ORDER — TRAMADOL HCL 50 MG PO TABS: 50.0000 mg | ORAL_TABLET | Freq: Four times a day (QID) | ORAL | Status: AC | PRN

## 2014-02-05 NOTE — ED Provider Notes (Addendum)
CSN: [id number]     Arrival date & time 02/05/14  1040 History   First MD Initiated Contact with Patient 02/05/14 1050     Chief Complaint  Patient presents with  . Abdominal Pain     (Consider location/radiation/quality/duration/timing/severity/associated sxs/prior Treatment) HPI Comments: Patient is a 33 year old female with history of recurrent abdominal pain. Last week she had surgery to remove a cyst between her breasts. She's been on antibiotics since that time and is now reporting generalized abdominal cramping, dysuria, and vaginal discharge. She reports fevers at home. She denies any bloody stool.  Patient is a 33 y.o. female presenting with abdominal pain.  Abdominal Pain Pain location:  Generalized Pain quality: cramping   Pain radiates to:  Does not radiate Pain severity:  Moderate Onset quality:  Sudden Duration:  3 days Timing:  Constant Progression:  Worsening Chronicity:  New Relieved by:  Nothing Worsened by:  Nothing tried Ineffective treatments:  None tried Associated symptoms: vaginal discharge   Associated symptoms: no chills, no constipation, no cough and no fever     Past Medical History  Diagnosis Date  . Crohn disease   . Abdominal pain, unspecified site   . Hypertension   . Diabetes mellitus   . Pancreatitis   . Fibromyalgia   . Migraine   . CHF (congestive heart failure)   . Pulmonary embolism   . Chronic pain   . Polycystic ovarian disease   . Ulcer of ileum   . Gastric ulcer    Past Surgical History  Procedure Laterality Date  . Tonsillectomy    . Breast surgery      cyst  . Cholecystectomy     History reviewed. No pertinent family history. History  Substance Use Topics  . Smoking status: Never Smoker   . Smokeless tobacco: Never Used  . Alcohol Use: No   OB History   Grav Para Term Preterm Abortions TAB SAB Ect Mult Living                 Review of Systems  Constitutional: Negative for fever and chills.  Respiratory:  Negative for cough.   Gastrointestinal: Positive for abdominal pain. Negative for constipation.  Genitourinary: Positive for vaginal discharge.  All other systems reviewed and are negative.      Allergies  Caffeine; Compazine; Decadron; Flagyl; Metoclopramide hcl; and Zofran  Home Medications   Current Outpatient Rx  Name  Route  Sig  Dispense  Refill  . acetaminophen (TYLENOL) 500 MG tablet   Oral   Take 1,000 mg by mouth every 6 (six) hours as needed. For pain         . carvedilol (COREG) 25 MG tablet   Oral   Take 25 mg by mouth 2 (two) times daily with a meal.         . cloNIDine (CATAPRES) 0.1 MG tablet   Oral   Take 0.1 mg by mouth daily as needed. For high blood pressure         . diazepam (VALIUM) 5 MG tablet   Oral   Take 5 mg by mouth 2 (two) times daily as needed. For anxiety or back pain         . HYDROcodone-acetaminophen (NORCO) 5-325 MG per tablet   Oral   Take 2 tablets by mouth every 4 (four) hours as needed for pain.   12 tablet   0   . HYDROcodone-acetaminophen (NORCO/VICODIN) 5-325 MG per tablet   Oral   Take  2 tablets by mouth every 4 (four) hours as needed.   20 tablet   0   . ibuprofen (ADVIL,MOTRIN) 200 MG tablet   Oral   Take 400 mg by mouth every 6 (six) hours as needed.         . lactulose (CEPHULAC) 10 G packet   Oral   Take 10 g by mouth 3 (three) times daily.         . mesalamine (ASACOL) 400 MG EC tablet   Oral   Take 800 mg by mouth daily.         . Olmesartan-Amlodipine-HCTZ (TRIBENZOR) 40-5-25 MG TABS   Oral   Take 1 tablet by mouth daily.           . Pediatric Multiple Vitamins (CHEWABLE MULTIPLE VITAMINS PO)   Oral   Take 1 tablet by mouth daily.         . promethazine (PHENERGAN) 25 MG tablet   Oral   Take 1 tablet (25 mg total) by mouth every 6 (six) hours as needed for nausea.   12 tablet   0   . promethazine (PHENERGAN) 25 MG tablet   Oral   Take 1 tablet (25 mg total) by mouth every 6  (six) hours as needed for nausea or vomiting.   30 tablet   0   . Vitamin D, Ergocalciferol, (DRISDOL) 50000 UNITS CAPS   Oral   Take 50,000 Units by mouth every 7 (seven) days. On Tuesday          BP 124/86  Pulse 67  Temp(Src) 98.1 F (36.7 C) (Oral)  Resp 16  SpO2 100%  LMP 01/23/2014 Physical Exam  Nursing note and vitals reviewed. Constitutional: She is oriented to person, place, and time. She appears well-developed and well-nourished. No distress.  HENT:  Head: Normocephalic and atraumatic.  Neck: Normal range of motion. Neck supple.  Cardiovascular: Normal rate and regular rhythm.  Exam reveals no gallop and no friction rub.   No murmur heard. Pulmonary/Chest: Effort normal and breath sounds normal. No respiratory distress. She has no wheezes.  Abdominal: Soft. Bowel sounds are normal. She exhibits no distension. There is tenderness.  There is mild tenderness to palpation in all 4 quadrants with no rebound or guarding.  Genitourinary: Uterus normal. Vaginal discharge found.  There is a mild to moderate whitish discharge present. There is no adnexal tenderness, masses, or cervical motion tenderness.  Musculoskeletal: Normal range of motion. She exhibits no edema.  Neurological: She is alert and oriented to person, place, and time.  Skin: Skin is warm and dry. She is not diaphoretic.    ED Course  Procedures (including critical care time) Labs Review Labs Reviewed  WET PREP, GENITAL  GC/CHLAMYDIA PROBE AMP  CBC WITH DIFFERENTIAL  COMPREHENSIVE METABOLIC PANEL  LIPASE, BLOOD  URINALYSIS, ROUTINE W REFLEX MICROSCOPIC  PREGNANCY, URINE   Imaging Review No results found.  Date: 02/05/2014  Rate: 89  Rhythm: normal sinus rhythm  QRS Axis: normal  Intervals: normal  ST/T Wave abnormalities: normal  Conduction Disutrbances:none  Narrative Interpretation:   Old EKG Reviewed: unchanged     MDM   Final diagnoses:  None    Patient is a 33 year old female  with history of chronic abdominal pain. She presents with complaints of generalized abdominal discomfort that has started since being started on an antibiotic for a cyst she had removed between her breasts. Workup reveals no elevation of white count, essentially unremarkable electrolytes and lipase, and  urinalysis is clear. Pelvic examination is unremarkable and wet prep reveals only a few white blood cells. She is concerned she is having a side effect of her antibiotics. I've advised her I will stop her doxycycline and change to Keflex. She will also be written medication for nausea and pain. She is to followup with her primary Dr. to discuss.    Geoffery Lyons, MD 02/05/14 1313  Geoffery Lyons, MD 02/05/14 [phone]

## 2014-02-05 NOTE — Discharge Instructions (Signed)
Stop your doxycycline and start taking Keflex as prescribed.  Phenergan as needed for nausea and tramadol as needed for pain.  If not improving in the next several days, followup with your primary Dr. to discuss these issues.   Abdominal Pain, Adult Many things can cause abdominal pain. Usually, abdominal pain is not caused by a disease and will improve without treatment. It can often be observed and treated at home. Your health care provider will do a physical exam and possibly order blood tests and X-rays to help determine the seriousness of your pain. However, in many cases, more time must pass before a clear cause of the pain can be found. Before that point, your health care provider may not know if you need more testing or further treatment. HOME CARE INSTRUCTIONS  Monitor your abdominal pain for any changes. The following actions may help to alleviate any discomfort you are experiencing:  Only take over-the-counter or prescription medicines as directed by your health care provider.  Do not take laxatives unless directed to do so by your health care provider.  Try a clear liquid diet (broth, tea, or water) as directed by your health care provider. Slowly move to a bland diet as tolerated. SEEK MEDICAL CARE IF:  You have unexplained abdominal pain.  You have abdominal pain associated with nausea or diarrhea.  You have pain when you urinate or have a bowel movement.  You experience abdominal pain that wakes you in the night.  You have abdominal pain that is worsened or improved by eating food.  You have abdominal pain that is worsened with eating fatty foods. SEEK IMMEDIATE MEDICAL CARE IF:   Your pain does not go away within 2 hours.  You have a fever.  You keep throwing up (vomiting).  Your pain is felt only in portions of the abdomen, such as the right side or the left lower portion of the abdomen.  You pass bloody or black tarry stools. MAKE SURE YOU:  Understand  these instructions.   Will watch your condition.   Will get help right away if you are not doing well or get worse.  Document Released: 09/16/2005 Document Revised: 09/27/2013 Document Reviewed: 08/16/2013 [facility] Patient Information 2014 [place], [place].

## 2014-02-05 NOTE — ED Notes (Signed)
Pt left without paperwork.  Pt called, reports that either she or her husband will be back up here to get them in a little while.

## 2014-02-05 NOTE — ED Notes (Signed)
Pt reports having surgery last wed to remove an abscess between her breasts.  Reports HA, abdominal pain, painful urination and vaginal discharge.  Denies f/u with surgeon until Wednesday.

## 2014-02-06 LAB — GC/CHLAMYDIA PROBE AMP
CT Probe RNA: NEGATIVE
GC Probe RNA: NEGATIVE

## 2014-08-07 ENCOUNTER — Emergency Department (HOSPITAL_BASED_OUTPATIENT_CLINIC_OR_DEPARTMENT_OTHER)
Admission: EM | Admit: 2014-08-07 | Discharge: 2014-08-07 | Disposition: A | Discharge status: Home / Self Care | Payer: Medicare Other | Attending: Emergency Medicine | Admitting: Emergency Medicine

## 2014-08-07 ENCOUNTER — Emergency Department (HOSPITAL_BASED_OUTPATIENT_CLINIC_OR_DEPARTMENT_OTHER): Payer: Medicare Other

## 2014-08-07 ENCOUNTER — Encounter (HOSPITAL_BASED_OUTPATIENT_CLINIC_OR_DEPARTMENT_OTHER): Payer: Self-pay | Admitting: Emergency Medicine

## 2014-08-07 DIAGNOSIS — Z8719 Personal history of other diseases of the digestive system: Secondary | ICD-10-CM | POA: Insufficient documentation

## 2014-08-07 DIAGNOSIS — Z9089 Acquired absence of other organs: Secondary | ICD-10-CM | POA: Insufficient documentation

## 2014-08-07 DIAGNOSIS — Z3202 Encounter for pregnancy test, result negative: Secondary | ICD-10-CM | POA: Diagnosis not present

## 2014-08-07 DIAGNOSIS — Z86711 Personal history of pulmonary embolism: Secondary | ICD-10-CM | POA: Diagnosis not present

## 2014-08-07 DIAGNOSIS — I509 Heart failure, unspecified: Secondary | ICD-10-CM | POA: Insufficient documentation

## 2014-08-07 DIAGNOSIS — R112 Nausea with vomiting, unspecified: Secondary | ICD-10-CM | POA: Diagnosis not present

## 2014-08-07 DIAGNOSIS — R109 Unspecified abdominal pain: Secondary | ICD-10-CM | POA: Diagnosis present

## 2014-08-07 DIAGNOSIS — N898 Other specified noninflammatory disorders of vagina: Secondary | ICD-10-CM | POA: Insufficient documentation

## 2014-08-07 DIAGNOSIS — I10 Essential (primary) hypertension: Secondary | ICD-10-CM | POA: Diagnosis not present

## 2014-08-07 DIAGNOSIS — R1084 Generalized abdominal pain: Secondary | ICD-10-CM | POA: Insufficient documentation

## 2014-08-07 DIAGNOSIS — Z79899 Other long term (current) drug therapy: Secondary | ICD-10-CM | POA: Insufficient documentation

## 2014-08-07 DIAGNOSIS — G43909 Migraine, unspecified, not intractable, without status migrainosus: Secondary | ICD-10-CM | POA: Insufficient documentation

## 2014-08-07 DIAGNOSIS — E119 Type 2 diabetes mellitus without complications: Secondary | ICD-10-CM | POA: Diagnosis not present

## 2014-08-07 DIAGNOSIS — G8929 Other chronic pain: Secondary | ICD-10-CM | POA: Diagnosis not present

## 2014-08-07 DIAGNOSIS — Z792 Long term (current) use of antibiotics: Secondary | ICD-10-CM | POA: Insufficient documentation

## 2014-08-07 DIAGNOSIS — E876 Hypokalemia: Secondary | ICD-10-CM

## 2014-08-07 LAB — URINALYSIS, ROUTINE W REFLEX MICROSCOPIC
Glucose, UA: NEGATIVE mg/dL
Ketones, ur: >80 mg/dL — AB
NITRITE: NEGATIVE
PH: 6 (ref 5.0–8.0)
Protein, ur: >300 mg/dL — AB
Specific Gravity, Urine: 1.038 — ABNORMAL HIGH (ref 1.005–1.030)
Urobilinogen, UA: 1 mg/dL (ref 0.0–1.0)

## 2014-08-07 LAB — URINE MICROSCOPIC-ADD ON

## 2014-08-07 LAB — CBC WITH DIFFERENTIAL/PLATELET
Basophils Absolute: 0 10*3/uL (ref 0.0–0.1)
Basophils Relative: 0 % (ref 0–1)
Eosinophils Absolute: 0.1 10*3/uL (ref 0.0–0.7)
Eosinophils Relative: 1 % (ref 0–5)
HEMATOCRIT: 39.1 % (ref 36.0–46.0)
HEMOGLOBIN: 13.1 g/dL (ref 12.0–15.0)
Lymphocytes Relative: 15 % (ref 12–46)
Lymphs Abs: 1.8 10*3/uL (ref 0.7–4.0)
MCH: 25.9 pg — AB (ref 26.0–34.0)
MCHC: 33.5 g/dL (ref 30.0–36.0)
MCV: 77.3 fL — ABNORMAL LOW (ref 78.0–100.0)
MONO ABS: 0.8 10*3/uL (ref 0.1–1.0)
MONOS PCT: 6 % (ref 3–12)
Neutro Abs: 9.7 10*3/uL — ABNORMAL HIGH (ref 1.7–7.7)
Neutrophils Relative %: 78 % — ABNORMAL HIGH (ref 43–77)
Platelets: 326 10*3/uL (ref 150–400)
RBC: 5.06 MIL/uL (ref 3.87–5.11)
RDW: 15.3 % (ref 11.5–15.5)
WBC: 12.4 10*3/uL — ABNORMAL HIGH (ref 4.0–10.5)

## 2014-08-07 LAB — COMPREHENSIVE METABOLIC PANEL
ALK PHOS: 118 U/L — AB (ref 39–117)
ALT: 28 U/L (ref 0–35)
AST: 41 U/L — AB (ref 0–37)
Albumin: 4.5 g/dL (ref 3.5–5.2)
Anion gap: 22 — ABNORMAL HIGH (ref 5–15)
BUN: 9 mg/dL (ref 6–23)
CHLORIDE: 92 meq/L — AB (ref 96–112)
CO2: 24 mEq/L (ref 19–32)
Calcium: 10.5 mg/dL (ref 8.4–10.5)
Creatinine, Ser: 0.7 mg/dL (ref 0.50–1.10)
GFR calc Af Amer: >90 mL/min (ref >90)
GFR calc non Af Amer: >90 mL/min (ref >90)
Glucose, Bld: 150 mg/dL — ABNORMAL HIGH (ref 70–99)
POTASSIUM: 3.2 meq/L — AB (ref 3.7–5.3)
SODIUM: 138 meq/L (ref 137–147)
TOTAL PROTEIN: 9.5 g/dL — AB (ref 6.0–8.3)
Total Bilirubin: 0.6 mg/dL (ref 0.3–1.2)

## 2014-08-07 LAB — TROPONIN I

## 2014-08-07 LAB — CBG MONITORING, ED: GLUCOSE-CAPILLARY: 121 mg/dL — AB (ref 70–99)

## 2014-08-07 LAB — PREGNANCY, URINE: Preg Test, Ur: NEGATIVE

## 2014-08-07 LAB — LIPASE, BLOOD: Lipase: 33 U/L (ref 11–59)

## 2014-08-07 MED ORDER — IOHEXOL 300 MG/ML  SOLN
50.0000 mL | Freq: Once | INTRAMUSCULAR | Status: AC | PRN
  Administered 2014-08-07: 50 mL via ORAL

## 2014-08-07 MED ORDER — PROMETHAZINE HCL 25 MG/ML IJ SOLN
INTRAMUSCULAR | Status: AC
  Filled 2014-08-07: qty 1

## 2014-08-07 MED ORDER — ONDANSETRON HCL 4 MG/2ML IJ SOLN
INTRAMUSCULAR | Status: AC
  Filled 2014-08-07: qty 2

## 2014-08-07 MED ORDER — HYDROMORPHONE HCL PF 1 MG/ML IJ SOLN
1.0000 mg | Freq: Once | INTRAMUSCULAR | Status: AC
  Administered 2014-08-07: 1 mg via INTRAVENOUS
  Filled 2014-08-07: qty 1

## 2014-08-07 MED ORDER — SODIUM CHLORIDE 0.9 % IV BOLUS (SEPSIS)
1000.0000 mL | Freq: Once | INTRAVENOUS | Status: AC
  Administered 2014-08-07: 1000 mL via INTRAVENOUS

## 2014-08-07 MED ORDER — PROMETHAZINE HCL 25 MG/ML IJ SOLN
12.5000 mg | Freq: Once | INTRAMUSCULAR | Status: AC
  Administered 2014-08-07: 12.5 mg via INTRAVENOUS
  Filled 2014-08-07: qty 1

## 2014-08-07 MED ORDER — PROMETHAZINE HCL 25 MG/ML IJ SOLN
12.5000 mg | Freq: Once | INTRAMUSCULAR | Status: AC
  Administered 2014-08-07: 12.5 mg via INTRAVENOUS

## 2014-08-07 MED ORDER — POTASSIUM CHLORIDE ER 10 MEQ PO TBCR: 10.0000 meq | EXTENDED_RELEASE_TABLET | Freq: Two times a day (BID) | ORAL | Status: AC

## 2014-08-07 MED ORDER — POTASSIUM CHLORIDE ER 10 MEQ PO TBCR: 10.0000 meq | EXTENDED_RELEASE_TABLET | Freq: Every day | ORAL | Status: AC

## 2014-08-07 MED ORDER — SODIUM CHLORIDE 0.9 % IV SOLN
INTRAVENOUS | Status: DC
  Administered 2014-08-07: 12:00:00 via INTRAVENOUS

## 2014-08-07 MED ORDER — IOHEXOL 300 MG/ML  SOLN
100.0000 mL | Freq: Once | INTRAMUSCULAR | Status: AC | PRN
  Administered 2014-08-07: 100 mL via INTRAVENOUS

## 2014-08-07 NOTE — Discharge Instructions (Signed)
Abdominal Pain, Women °Abdominal (stomach, pelvic, or belly) pain can be caused by many things. It is important to tell your doctor: °· The location of the pain. °· Does it come and go or is it present all the time? °· Are there things that start the pain (eating certain foods, exercise)? °· Are there other symptoms associated with the pain (fever, nausea, vomiting, diarrhea)? °All of this is helpful to know when trying to find the cause of the pain. °CAUSES  °· Stomach: virus or bacteria infection, or ulcer. °· Intestine: appendicitis (inflamed appendix), regional ileitis (Crohn's disease), ulcerative colitis (inflamed colon), irritable bowel syndrome, diverticulitis (inflamed diverticulum of the colon), or cancer of the stomach or intestine. °· Gallbladder disease or stones in the gallbladder. °· Kidney disease, kidney stones, or infection. °· Pancreas infection or cancer. °· Fibromyalgia (pain disorder). °· Diseases of the female organs: °¨ Uterus: fibroid (non-cancerous) tumors or infection. °¨ Fallopian tubes: infection or tubal pregnancy. °¨ Ovary: cysts or tumors. °¨ Pelvic adhesions (scar tissue). °¨ Endometriosis (uterus lining tissue growing in the pelvis and on the pelvic organs). °¨ Pelvic congestion syndrome (female organs filling up with blood just before the menstrual period). °¨ Pain with the menstrual period. °¨ Pain with ovulation (producing an egg). °¨ Pain with an IUD (intrauterine device, birth control) in the uterus. °¨ Cancer of the female organs. °· Functional pain (pain not caused by a disease, may improve without treatment). °· Psychological pain. °· Depression. °DIAGNOSIS  °Your doctor will decide the seriousness of your pain by doing an examination. °· Blood tests. °· X-rays. °· Ultrasound. °· CT scan (computed tomography, special type of X-ray). °· MRI (magnetic resonance imaging). °· Cultures, for infection. °· Barium enema (dye inserted in the large intestine, to better view it with  X-rays). °· Colonoscopy (looking in intestine with a lighted tube). °· Laparoscopy (minor surgery, looking in abdomen with a lighted tube). °· Major abdominal exploratory surgery (looking in abdomen with a large incision). °TREATMENT  °The treatment will depend on the cause of the pain.  °· Many cases can be observed and treated at home. °· Over-the-counter medicines recommended by your caregiver. °· Prescription medicine. °· Antibiotics, for infection. °· Birth control pills, for painful periods or for ovulation pain. °· Hormone treatment, for endometriosis. °· Nerve blocking injections. °· Physical therapy. °· Antidepressants. °· Counseling with a psychologist or psychiatrist. °· Minor or major surgery. °HOME CARE INSTRUCTIONS  °· Do not take laxatives, unless directed by your caregiver. °· Take over-the-counter pain medicine only if ordered by your caregiver. Do not take aspirin because it can cause an upset stomach or bleeding. °· Try a clear liquid diet (broth or water) as ordered by your caregiver. Slowly move to a bland diet, as tolerated, if the pain is related to the stomach or intestine. °· Have a thermometer and take your temperature several times a day, and record it. °· Bed rest and sleep, if it helps the pain. °· Avoid sexual intercourse, if it causes pain. °· Avoid stressful situations. °· Keep your follow-up appointments and tests, as your caregiver orders. °· If the pain does not go away with medicine or surgery, you may try: °¨ Acupuncture. °¨ Relaxation exercises (yoga, meditation). °¨ Group therapy. °¨ Counseling. °SEEK MEDICAL CARE IF:  °· You notice certain foods cause stomach pain. °· Your home care treatment is not helping your pain. °· You need stronger pain medicine. °· You want your IUD removed. °· You feel faint or   lightheaded.  You develop nausea and vomiting.  You develop a rash.  You are having side effects or an allergy to your medicine. SEEK IMMEDIATE MEDICAL CARE IF:   Your  pain does not go away or gets worse.  You have a fever.  Your pain is felt only in portions of the abdomen. The right side could possibly be appendicitis. The left lower portion of the abdomen could be colitis or diverticulitis.  You are passing blood in your stools (bright red or black tarry stools, with or without vomiting).  You have blood in your urine.  You develop chills, with or without a fever.  You pass out. MAKE SURE YOU:   Understand these instructions.  Will watch your condition.  Will get help right away if you are not doing well or get worse. Document Released: 10/04/2007 Document Revised: 04/23/2014 Document Reviewed: 10/24/2009 [facility] Patient Information 2015 [place]. This information is not intended to replace advice given to you by your health care provider. Make sure you discuss any questions you have with your health care provider.   Continue take the Phenergan, clear liquids today as a bland diet as tolerated. Take a potassium supplement pills as directed. Return for any newer worse symptoms. Followup with your doctor in the next few days.

## 2014-08-07 NOTE — ED Notes (Signed)
Abdominal pain, vomiting, hypotension, low pulse and generalized weakness x 6 days.

## 2014-08-07 NOTE — ED Provider Notes (Signed)
CSN: [id number]     Arrival date & time 08/07/14  9201 History   First MD Initiated Contact with Patient 08/07/14 1011     Chief Complaint  Patient presents with  . Abdominal Pain     (Consider location/radiation/quality/duration/timing/severity/associated sxs/prior Treatment) Patient is a 33 y.o. female presenting with abdominal pain. The history is provided by the patient.  Abdominal Pain Pain location:  Generalized Associated symptoms: nausea, vaginal bleeding and vomiting   Associated symptoms: no chest pain, no dysuria, no fever and no shortness of breath    patient with generalized abdominal pain for 6 days associated with nausea and vomiting no diarrhea. No fever. Patient thought her blood pressure was low at home. Patient has a history of hypertension. Patient also has past history of pancreatitis and Crohn's disease. No blood in the bowel movements no blood in the vomit. Patient is currently on her menstrual period.  Past Medical History  Diagnosis Date  . Crohn disease   . Abdominal pain, unspecified site   . Hypertension   . Diabetes mellitus   . Pancreatitis   . Fibromyalgia   . Migraine   . CHF (congestive heart failure)   . Pulmonary embolism   . Chronic pain   . Polycystic ovarian disease   . Ulcer of ileum   . Gastric ulcer    Past Surgical History  Procedure Laterality Date  . Tonsillectomy    . Breast surgery      cyst  . Cholecystectomy     No family history on file. History  Substance Use Topics  . Smoking status: Never Smoker   . Smokeless tobacco: Never Used  . Alcohol Use: No   OB History   Grav Para Term Preterm Abortions TAB SAB Ect Mult Living                 Review of Systems  Constitutional: Negative for fever.  HENT: Negative for congestion.   Eyes: Negative for visual disturbance.  Respiratory: Negative for shortness of breath.   Cardiovascular: Negative for chest pain.  Gastrointestinal: Positive for nausea, vomiting and  abdominal pain.  Genitourinary: Positive for vaginal bleeding. Negative for dysuria.  Musculoskeletal: Negative for back pain.  Skin: Negative for rash.  Neurological: Negative for headaches.  Hematological: Does not bruise/bleed easily.  Psychiatric/Behavioral: Negative for confusion.      Allergies  Caffeine; Compazine; Decadron; Flagyl; Metoclopramide hcl; and Zofran  Home Medications   Prior to Admission medications   Medication Sig Start Date End Date Taking? Authorizing Provider  hydrochlorothiazide (HYDRODIURIL) 25 MG tablet Take 25 mg by mouth daily.   Yes Historical Provider, MD  oxyCODONE-acetaminophen (PERCOCET) 10-325 MG per tablet Take 1 tablet by mouth every 4 (four) hours as needed for pain.   Yes Historical Provider, MD  acetaminophen (TYLENOL) 500 MG tablet Take 1,000 mg by mouth every 6 (six) hours as needed. For pain    Historical Provider, MD  carvedilol (COREG) 25 MG tablet Take 25 mg by mouth 2 (two) times daily with a meal.    Historical Provider, MD  cephALEXin (KEFLEX) 500 MG capsule Take 1 capsule (500 mg total) by mouth 4 (four) times daily. 02/05/14   Geoffery Lyons, MD  cloNIDine (CATAPRES) 0.1 MG tablet Take 0.1 mg by mouth daily as needed. For high blood pressure    Historical Provider, MD  diazepam (VALIUM) 5 MG tablet Take 5 mg by mouth 2 (two) times daily as needed. For anxiety or back pain  Historical Provider, MD  HYDROcodone-acetaminophen (NORCO) 5-325 MG per tablet Take 2 tablets by mouth every 4 (four) hours as needed for pain. 05/15/13   Geoffery Lyonsouglas Delo, MD  HYDROcodone-acetaminophen (NORCO/VICODIN) 5-325 MG per tablet Take 2 tablets by mouth every 4 (four) hours as needed. 01/24/14   Elson AreasLeslie K Sofia, PA-C  ibuprofen (ADVIL,MOTRIN) 200 MG tablet Take 400 mg by mouth every 6 (six) hours as needed.    Historical Provider, MD  lactulose (CEPHULAC) 10 G packet Take 10 g by mouth 3 (three) times daily.    Historical Provider, MD  mesalamine (ASACOL) 400 MG EC  tablet Take 800 mg by mouth daily.    Historical Provider, MD  Olmesartan-Amlodipine-HCTZ (TRIBENZOR) 40-5-25 MG TABS Take 1 tablet by mouth daily.      Historical Provider, MD  Pediatric Multiple Vitamins (CHEWABLE MULTIPLE VITAMINS PO) Take 1 tablet by mouth daily.    Historical Provider, MD  potassium chloride (K-DUR) 10 MEQ tablet Take 1 tablet (10 mEq total) by mouth 2 (two) times daily. 08/07/14   Vanetta MuldersScott Malarie Tappen, MD  potassium chloride (K-DUR) 10 MEQ tablet Take 1 tablet (10 mEq total) by mouth daily. 08/07/14   Vanetta MuldersScott Kathleen Tamm, MD  promethazine (PHENERGAN) 25 MG tablet Take 1 tablet (25 mg total) by mouth every 6 (six) hours as needed for nausea. 05/15/13   Geoffery Lyonsouglas Delo, MD  promethazine (PHENERGAN) 25 MG tablet Take 1 tablet (25 mg total) by mouth every 6 (six) hours as needed for nausea or vomiting. 01/24/14   Elson AreasLeslie K Sofia, PA-C  promethazine (PHENERGAN) 25 MG tablet Take 1 tablet (25 mg total) by mouth every 6 (six) hours as needed for nausea. 02/05/14   Geoffery Lyonsouglas Delo, MD  traMADol (ULTRAM) 50 MG tablet Take 1 tablet (50 mg total) by mouth every 6 (six) hours as needed. 02/05/14   Geoffery Lyonsouglas Delo, MD  Vitamin D, Ergocalciferol, (DRISDOL) 50000 UNITS CAPS Take 50,000 Units by mouth every 7 (seven) days. On Tuesday    Historical Provider, MD   BP 141/100  Pulse 107  Temp(Src) 98.4 F (36.9 C) (Oral)  Resp 18  Ht 5\' 6"  (1.676 m)  Wt 195 lb (88.451 kg)  BMI 31.49 kg/m2  SpO2 100%  LMP 08/04/2014 Physical Exam  Nursing note and vitals reviewed. Constitutional: She is oriented to person, place, and time. She appears well-developed and well-nourished.  HENT:  Head: Normocephalic and atraumatic.  Eyes: Conjunctivae and EOM are normal. Pupils are equal, round, and reactive to light.  Neck: Normal range of motion.  Cardiovascular: Normal rate, regular rhythm and normal heart sounds.   No murmur heard. Pulmonary/Chest: Effort normal and breath sounds normal. No respiratory distress.  Abdominal:  Soft. Bowel sounds are normal. There is no tenderness.  Musculoskeletal: Normal range of motion. She exhibits no edema.  Neurological: She is alert and oriented to person, place, and time. No cranial nerve deficit. She exhibits normal muscle tone. Coordination normal.  Skin: Skin is warm. No rash noted.    ED Course  Procedures (including critical care time) Labs Review Labs Reviewed  URINALYSIS, ROUTINE W REFLEX MICROSCOPIC - Abnormal; Notable for the following:    Color, Urine RED (*)    APPearance CLOUDY (*)    Specific Gravity, Urine 1.038 (*)    Hgb urine dipstick LARGE (*)    Bilirubin Urine MODERATE (*)    Ketones, ur >80 (*)    Protein, ur >300 (*)    Leukocytes, UA SMALL (*)    All other components  within normal limits  COMPREHENSIVE METABOLIC PANEL - Abnormal; Notable for the following:    Potassium 3.2 (*)    Chloride 92 (*)    Glucose, Bld 150 (*)    Total Protein 9.5 (*)    AST 41 (*)    Alkaline Phosphatase 118 (*)    Anion gap 22 (*)    All other components within normal limits  CBC WITH DIFFERENTIAL - Abnormal; Notable for the following:    WBC 12.4 (*)    MCV 77.3 (*)    MCH 25.9 (*)    Neutrophils Relative % 78 (*)    Neutro Abs 9.7 (*)    All other components within normal limits  URINE MICROSCOPIC-ADD ON - Abnormal; Notable for the following:    Bacteria, UA MANY (*)    All other components within normal limits  CBG MONITORING, ED - Abnormal; Notable for the following:    Glucose-Capillary 121 (*)    All other components within normal limits  PREGNANCY, URINE  LIPASE, BLOOD  TROPONIN I   Results for orders placed during the hospital encounter of 08/07/14  PREGNANCY, URINE      Result Value Ref Range   Preg Test, Ur NEGATIVE  NEGATIVE  URINALYSIS, ROUTINE W REFLEX MICROSCOPIC      Result Value Ref Range   Color, Urine RED (*) YELLOW   APPearance CLOUDY (*) CLEAR   Specific Gravity, Urine 1.038 (*) 1.005 - 1.030   pH 6.0  5.0 - 8.0   Glucose,  UA NEGATIVE  NEGATIVE mg/dL   Hgb urine dipstick LARGE (*) NEGATIVE   Bilirubin Urine MODERATE (*) NEGATIVE   Ketones, ur >80 (*) NEGATIVE mg/dL   Protein, ur >161 (*) NEGATIVE mg/dL   Urobilinogen, UA 1.0  0.0 - 1.0 mg/dL   Nitrite NEGATIVE  NEGATIVE   Leukocytes, UA SMALL (*) NEGATIVE  COMPREHENSIVE METABOLIC PANEL      Result Value Ref Range   Sodium 138  137 - 147 mEq/L   Potassium 3.2 (*) 3.7 - 5.3 mEq/L   Chloride 92 (*) 96 - 112 mEq/L   CO2 24  19 - 32 mEq/L   Glucose, Bld 150 (*) 70 - 99 mg/dL   BUN 9  6 - 23 mg/dL   Creatinine, Ser 0.96  0.50 - 1.10 mg/dL   Calcium 04.5  8.4 - 40.9 mg/dL   Total Protein 9.5 (*) 6.0 - 8.3 g/dL   Albumin 4.5  3.5 - 5.2 g/dL   AST 41 (*) 0 - 37 U/L   ALT 28  0 - 35 U/L   Alkaline Phosphatase 118 (*) 39 - 117 U/L   Total Bilirubin 0.6  0.3 - 1.2 mg/dL   GFR calc non Af Amer >90  >90 mL/min   GFR calc Af Amer >90  >90 mL/min   Anion gap 22 (*) 5 - 15  CBC WITH DIFFERENTIAL      Result Value Ref Range   WBC 12.4 (*) 4.0 - 10.5 K/uL   RBC 5.06  3.87 - 5.11 MIL/uL   Hemoglobin 13.1  12.0 - 15.0 g/dL   HCT 81.1  91.4 - 78.2 %   MCV 77.3 (*) 78.0 - 100.0 fL   MCH 25.9 (*) 26.0 - 34.0 pg   MCHC 33.5  30.0 - 36.0 g/dL   RDW 95.6  21.3 - 08.6 %   Platelets 326  150 - 400 K/uL   Neutrophils Relative % 78 (*) 43 - 77 %  Neutro Abs 9.7 (*) 1.7 - 7.7 K/uL   Lymphocytes Relative 15  12 - 46 %   Lymphs Abs 1.8  0.7 - 4.0 K/uL   Monocytes Relative 6  3 - 12 %   Monocytes Absolute 0.8  0.1 - 1.0 K/uL   Eosinophils Relative 1  0 - 5 %   Eosinophils Absolute 0.1  0.0 - 0.7 K/uL   Basophils Relative 0  0 - 1 %   Basophils Absolute 0.0  0.0 - 0.1 K/uL  URINE MICROSCOPIC-ADD ON      Result Value Ref Range   Squamous Epithelial / LPF RARE  RARE   WBC, UA 7-10  <3 WBC/hpf   RBC / HPF TOO NUMEROUS TO COUNT  <3 RBC/hpf   Bacteria, UA MANY (*) RARE  LIPASE, BLOOD      Result Value Ref Range   Lipase 33  11 - 59 U/L  TROPONIN I      Result Value  Ref Range   Troponin I <0.30  <0.30 ng/mL  CBG MONITORING, ED      Result Value Ref Range   Glucose-Capillary 121 (*) 70 - 99 mg/dL     Imaging Review Dg Chest 2 View  08/07/2014   CLINICAL DATA:  ABDOMINAL PAIN  EXAM: CHEST - 2 VIEW  COMPARISON:  CT 08/29/2013 and earlier studies  FINDINGS: Lungs are clear. Heart size and mediastinal contours are within normal limits. No effusion. Visualized skeletal structures are unremarkable. Surgical clips in the upper abdomen.  IMPRESSION: No acute cardiopulmonary disease.   Electronically Signed   By: Oley Balm M.D.   On: 08/07/2014 13:33   Ct Abdomen Pelvis W Contrast  08/07/2014   CLINICAL DATA:  Abdominal pain; history of Crohn's disease  EXAM: CT ABDOMEN AND PELVIS WITH CONTRAST  TECHNIQUE: Multidetector CT imaging of the abdomen and pelvis was performed using the standard protocol following bolus administration of intravenous contrast. Oral contrast was also administered.  CONTRAST:  OMNIPAQUE IOHEXOL 300 MG/ML  SOLN  COMPARISON:  May 09, 2014  FINDINGS: There is mild left base atelectasis. Lung bases are otherwise clear.  There is hepatic steatosis. No focal liver lesions are identified beyond fatty change. Gallbladder is absent. There is no appreciable biliary duct dilatation.  Spleen, pancreas, and adrenals appear normal. Kidneys bilaterally show no mass or hydronephrosis on either side. There is no renal or ureteral calculus on either side. There is a circumaortic left renal vein, an anatomic variant.  In the pelvis, the urinary bladder is midline with normal wall thickness. There is no pelvic mass or fluid collection.  Appendix contains either calcification or prior oral contrast. The appendix is not distended, and there is no periappendiceal region inflammation.  Terminal ileum appears normal. There is no terminal ileal fistula or mesenteric thickening/inflammation.  There is mild scarring in the periumbilical region, probably secondary to  the previous cholecystectomy.  There is no bowel obstruction.  No free air or portal venous air.  There is no ascites, adenopathy, or abscess in the abdomen or pelvis. There is no abdominal aortic aneurysm. There are no blastic or lytic bone lesions. 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  IMPRESSION: Hepatic steatosis is again noted.  Gallbladder is absent.  No bowel obstruction. No abscess. No appendiceal inflammation. Terminal ileum appears normal. No renal or ureteral calculus. No hydronephrosis.   Electronically Signed   By: Bretta Bang M.D.   On: 08/07/2014 13:15  EKG Interpretation   Date/Time:  Tuesday August 07 2014 09:35:37 EDT Ventricular Rate:  122 PR Interval:  162 QRS Duration: 90 QT Interval:  332 QTC Calculation: 473 R Axis:   37 Text Interpretation:  Sinus tachycardia Possible Anterior infarct , age  undetermined Abnormal ECG No significant change since last tracing  Confirmed by Akasha Melena  MD, Toben Acuna [phone]) on 08/07/2014 10:56:16 AM      MDM   Final diagnoses:  Non-intractable vomiting with nausea, vomiting of unspecified type  Abdominal pain, unspecified abdominal location  Hypokalemia    Patient with persistent nausea and vomiting improve some here in the emergency department. Patient reported low blood pressures at home however no evidence of that here. Patient has Phenergan at home. Potassium was slightly low at 3.2. Will receive potassium supplementation. Workup for the abdominal pain was negative no evidence of pancreatitis based on labs or CT scan. No evidence of any exacerbation of her Crohn's disease. Patient hydrated here. Patient will continue home and has 4 days of potassium supplementation patient has regular Dr. followup with.  Patient's urinalysis with too numerous to count red blood cells. Patient is currently on her menstrual period suspect that that is the cause of that finding. Patient does not have any urinary  tract infection type symptoms. However urine culture will be sent.  Vanetta Mulders, MD 08/07/14 1524

## 2014-09-12 ENCOUNTER — Encounter (HOSPITAL_BASED_OUTPATIENT_CLINIC_OR_DEPARTMENT_OTHER): Payer: Self-pay | Admitting: Emergency Medicine

## 2014-09-12 ENCOUNTER — Emergency Department (HOSPITAL_BASED_OUTPATIENT_CLINIC_OR_DEPARTMENT_OTHER): Payer: Medicare Other

## 2014-09-12 ENCOUNTER — Emergency Department (HOSPITAL_BASED_OUTPATIENT_CLINIC_OR_DEPARTMENT_OTHER)
Admission: EM | Admit: 2014-09-12 | Discharge: 2014-09-12 | Disposition: A | Discharge status: Home / Self Care | Payer: Medicare Other | Attending: Emergency Medicine | Admitting: Emergency Medicine

## 2014-09-12 DIAGNOSIS — J069 Acute upper respiratory infection, unspecified: Secondary | ICD-10-CM | POA: Insufficient documentation

## 2014-09-12 DIAGNOSIS — Z3202 Encounter for pregnancy test, result negative: Secondary | ICD-10-CM | POA: Diagnosis not present

## 2014-09-12 DIAGNOSIS — R51 Headache: Secondary | ICD-10-CM

## 2014-09-12 DIAGNOSIS — Z86711 Personal history of pulmonary embolism: Secondary | ICD-10-CM | POA: Diagnosis not present

## 2014-09-12 DIAGNOSIS — R059 Cough, unspecified: Secondary | ICD-10-CM | POA: Diagnosis present

## 2014-09-12 DIAGNOSIS — G8929 Other chronic pain: Secondary | ICD-10-CM | POA: Diagnosis not present

## 2014-09-12 DIAGNOSIS — I509 Heart failure, unspecified: Secondary | ICD-10-CM | POA: Insufficient documentation

## 2014-09-12 DIAGNOSIS — G43909 Migraine, unspecified, not intractable, without status migrainosus: Secondary | ICD-10-CM | POA: Insufficient documentation

## 2014-09-12 DIAGNOSIS — Z792 Long term (current) use of antibiotics: Secondary | ICD-10-CM | POA: Insufficient documentation

## 2014-09-12 DIAGNOSIS — K5090 Crohn's disease, unspecified, without complications: Secondary | ICD-10-CM | POA: Diagnosis not present

## 2014-09-12 DIAGNOSIS — R05 Cough: Secondary | ICD-10-CM | POA: Insufficient documentation

## 2014-09-12 DIAGNOSIS — R519 Headache, unspecified: Secondary | ICD-10-CM

## 2014-09-12 DIAGNOSIS — E119 Type 2 diabetes mellitus without complications: Secondary | ICD-10-CM | POA: Diagnosis not present

## 2014-09-12 DIAGNOSIS — Z79899 Other long term (current) drug therapy: Secondary | ICD-10-CM | POA: Insufficient documentation

## 2014-09-12 DIAGNOSIS — I10 Essential (primary) hypertension: Secondary | ICD-10-CM | POA: Insufficient documentation

## 2014-09-12 LAB — CBC WITH DIFFERENTIAL/PLATELET
BASOS ABS: 0 10*3/uL (ref 0.0–0.1)
Basophils Relative: 0 % (ref 0–1)
Eosinophils Absolute: 0.1 10*3/uL (ref 0.0–0.7)
Eosinophils Relative: 1 % (ref 0–5)
HCT: 30.6 % — ABNORMAL LOW (ref 36.0–46.0)
HEMOGLOBIN: 10.1 g/dL — AB (ref 12.0–15.0)
LYMPHS PCT: 28 % (ref 12–46)
Lymphs Abs: 2.2 10*3/uL (ref 0.7–4.0)
MCH: 25.6 pg — ABNORMAL LOW (ref 26.0–34.0)
MCHC: 33 g/dL (ref 30.0–36.0)
MCV: 77.7 fL — ABNORMAL LOW (ref 78.0–100.0)
MONO ABS: 0.7 10*3/uL (ref 0.1–1.0)
MONOS PCT: 9 % (ref 3–12)
NEUTROS ABS: 4.7 10*3/uL (ref 1.7–7.7)
NEUTROS PCT: 62 % (ref 43–77)
Platelets: 258 10*3/uL (ref 150–400)
RBC: 3.94 MIL/uL (ref 3.87–5.11)
RDW: 15.1 % (ref 11.5–15.5)
WBC: 7.6 10*3/uL (ref 4.0–10.5)

## 2014-09-12 LAB — URINALYSIS, ROUTINE W REFLEX MICROSCOPIC
BILIRUBIN URINE: NEGATIVE
Glucose, UA: NEGATIVE mg/dL
Hgb urine dipstick: NEGATIVE
Ketones, ur: NEGATIVE mg/dL
Leukocytes, UA: NEGATIVE
NITRITE: NEGATIVE
Protein, ur: NEGATIVE mg/dL
SPECIFIC GRAVITY, URINE: 1.018 (ref 1.005–1.030)
Urobilinogen, UA: 0.2 mg/dL (ref 0.0–1.0)
pH: 7.5 (ref 5.0–8.0)

## 2014-09-12 LAB — BASIC METABOLIC PANEL
ANION GAP: 14 (ref 5–15)
BUN: 6 mg/dL (ref 6–23)
CHLORIDE: 100 meq/L (ref 96–112)
CO2: 26 meq/L (ref 19–32)
Calcium: 10 mg/dL (ref 8.4–10.5)
Creatinine, Ser: 0.5 mg/dL (ref 0.50–1.10)
GFR calc non Af Amer: >90 mL/min (ref >90)
Glucose, Bld: 116 mg/dL — ABNORMAL HIGH (ref 70–99)
POTASSIUM: 3.7 meq/L (ref 3.7–5.3)
Sodium: 140 mEq/L (ref 137–147)

## 2014-09-12 LAB — PREGNANCY, URINE: Preg Test, Ur: NEGATIVE

## 2014-09-12 MED ORDER — SODIUM CHLORIDE 0.9 % IV BOLUS (SEPSIS)
1000.0000 mL | Freq: Once | INTRAVENOUS | Status: AC
  Administered 2014-09-12: 1000 mL via INTRAVENOUS

## 2014-09-12 MED ORDER — KETOROLAC TROMETHAMINE 30 MG/ML IJ SOLN
30.0000 mg | Freq: Once | INTRAMUSCULAR | Status: AC
  Administered 2014-09-12: 30 mg via INTRAVENOUS
  Filled 2014-09-12: qty 1

## 2014-09-12 MED ORDER — ACETAMINOPHEN 500 MG PO TABS
1000.0000 mg | ORAL_TABLET | Freq: Once | ORAL | Status: AC
  Administered 2014-09-12: 1000 mg via ORAL
  Filled 2014-09-12: qty 2

## 2014-09-12 MED ORDER — HYDROMORPHONE HCL 1 MG/ML IJ SOLN
1.0000 mg | Freq: Once | INTRAMUSCULAR | Status: AC
  Administered 2014-09-12: 1 mg via INTRAVENOUS
  Filled 2014-09-12: qty 1

## 2014-09-12 MED ORDER — PROMETHAZINE HCL 25 MG PO TABS
25.0000 mg | ORAL_TABLET | Freq: Four times a day (QID) | ORAL | Status: DC | PRN
  Administered 2014-09-12: 25 mg via ORAL
  Filled 2014-09-12: qty 1

## 2014-09-12 NOTE — ED Provider Notes (Signed)
CSN: [id number]     Arrival date & time 09/12/14  1100 History   First MD Initiated Contact with Patient 09/12/14 1319     Chief Complaint  Patient presents with  . Cough  . Migraine     (Consider location/radiation/quality/duration/timing/severity/associated sxs/prior Treatment) Patient is a 33 y.o. female presenting with headaches.  Headache Pain location:  Generalized Quality:  Sharp (throbbing) Pain severity now: severe. Onset quality:  Gradual Duration:  3 days Timing:  Constant Progression:  Worsening Chronicity:  Recurrent Relieved by:  Nothing Worsened by:  Nothing tried Associated symptoms: congestion, cough, myalgias, nausea, photophobia and vomiting   Associated symptoms: no abdominal pain and no fever     Past Medical History  Diagnosis Date  . Crohn disease   . Abdominal pain, unspecified site   . Hypertension   . Diabetes mellitus   . Pancreatitis   . Fibromyalgia   . Migraine   . CHF (congestive heart failure)   . Pulmonary embolism   . Chronic pain   . Polycystic ovarian disease   . Ulcer of ileum   . Gastric ulcer    Past Surgical History  Procedure Laterality Date  . Tonsillectomy    . Breast surgery      cyst  . Cholecystectomy     No family history on file. History  Substance Use Topics  . Smoking status: Never Smoker   . Smokeless tobacco: Never Used  . Alcohol Use: No   OB History   Grav Para Term Preterm Abortions TAB SAB Ect Mult Living                 Review of Systems  Constitutional: Negative for fever.  HENT: Positive for congestion.   Eyes: Positive for photophobia.  Respiratory: Positive for cough.   Gastrointestinal: Positive for nausea and vomiting. Negative for abdominal pain.  Musculoskeletal: Positive for myalgias.  Neurological: Positive for headaches.  All other systems reviewed and are negative.     Allergies  Caffeine; Compazine; Decadron; Flagyl; Metoclopramide hcl; and Zofran  Home Medications    Prior to Admission medications   Medication Sig Start Date End Date Taking? Authorizing Provider  acetaminophen (TYLENOL) 500 MG tablet Take 1,000 mg by mouth every 6 (six) hours as needed. For pain   Yes Historical Provider, MD  cloNIDine (CATAPRES) 0.1 MG tablet Take 0.1 mg by mouth daily as needed. For high blood pressure   Yes Historical Provider, MD  diazepam (VALIUM) 5 MG tablet Take 5 mg by mouth 2 (two) times daily as needed. For anxiety or back pain   Yes Historical Provider, MD  hydrochlorothiazide (HYDRODIURIL) 25 MG tablet Take 25 mg by mouth daily.   Yes Historical Provider, MD  oxyCODONE-acetaminophen (PERCOCET) 10-325 MG per tablet Take 1 tablet by mouth every 4 (four) hours as needed for pain.   Yes Historical Provider, MD  promethazine (PHENERGAN) 25 MG tablet Take 1 tablet (25 mg total) by mouth every 6 (six) hours as needed for nausea. 02/05/14  Yes Geoffery Lyons, MD  carvedilol (COREG) 25 MG tablet Take 25 mg by mouth 2 (two) times daily with a meal.    Historical Provider, MD  cephALEXin (KEFLEX) 500 MG capsule Take 1 capsule (500 mg total) by mouth 4 (four) times daily. 02/05/14   Geoffery Lyons, MD  HYDROcodone-acetaminophen (NORCO) 5-325 MG per tablet Take 2 tablets by mouth every 4 (four) hours as needed for pain. 05/15/13   Geoffery Lyons, MD  HYDROcodone-acetaminophen (NORCO/VICODIN) [phone]  MG per tablet Take 2 tablets by mouth every 4 (four) hours as needed. 01/24/14   Elson Areas, PA-C  ibuprofen (ADVIL,MOTRIN) 200 MG tablet Take 400 mg by mouth every 6 (six) hours as needed.    Historical Provider, MD  lactulose (CEPHULAC) 10 G packet Take 10 g by mouth 3 (three) times daily.    Historical Provider, MD  mesalamine (ASACOL) 400 MG EC tablet Take 800 mg by mouth daily.    Historical Provider, MD  Olmesartan-Amlodipine-HCTZ (TRIBENZOR) 40-5-25 MG TABS Take 1 tablet by mouth daily.      Historical Provider, MD  Pediatric Multiple Vitamins (CHEWABLE MULTIPLE VITAMINS PO) Take 1  tablet by mouth daily.    Historical Provider, MD  potassium chloride (K-DUR) 10 MEQ tablet Take 1 tablet (10 mEq total) by mouth 2 (two) times daily. 08/07/14   Vanetta Mulders, MD  potassium chloride (K-DUR) 10 MEQ tablet Take 1 tablet (10 mEq total) by mouth daily. 08/07/14   Vanetta Mulders, MD  promethazine (PHENERGAN) 25 MG tablet Take 1 tablet (25 mg total) by mouth every 6 (six) hours as needed for nausea. 05/15/13   Geoffery Lyons, MD  promethazine (PHENERGAN) 25 MG tablet Take 1 tablet (25 mg total) by mouth every 6 (six) hours as needed for nausea or vomiting. 01/24/14   Elson Areas, PA-C  traMADol (ULTRAM) 50 MG tablet Take 1 tablet (50 mg total) by mouth every 6 (six) hours as needed. 02/05/14   Geoffery Lyons, MD  Vitamin D, Ergocalciferol, (DRISDOL) 50000 UNITS CAPS Take 50,000 Units by mouth every 7 (seven) days. On Tuesday    Historical Provider, MD   BP 133/77  Pulse 104  Temp(Src) 98.3 F (36.8 C) (Oral)  Resp 20  Ht  (1.676 m)  Wt 180 lb (81.647 kg)  BMI 29.07 kg/m2  SpO2 99%  LMP 09/03/2014 Physical Exam  Nursing note and vitals reviewed. Constitutional: She is oriented to person, place, and time. She appears well-developed and well-nourished. No distress.  Appears uncomfortable.  Covering eyes  HENT:  Head: Normocephalic and atraumatic.  Nose: Mucosal edema present. Right sinus exhibits no maxillary sinus tenderness and no frontal sinus tenderness. Left sinus exhibits no maxillary sinus tenderness and no frontal sinus tenderness.  Mouth/Throat: Oropharynx is clear and moist.  Eyes: Conjunctivae are normal. Pupils are equal, round, and reactive to light. No scleral icterus.  Neck: Neck supple.  Cardiovascular: Normal rate, regular rhythm, normal heart sounds and intact distal pulses.   No murmur heard. Pulmonary/Chest: Effort normal and breath sounds normal. No stridor. No respiratory distress. She has no rales.  Abdominal: Soft. Bowel sounds are normal. She exhibits  no distension. There is no tenderness.  Musculoskeletal: Normal range of motion.  Neurological: She is alert and oriented to person, place, and time. She has normal strength. No cranial nerve deficit or sensory deficit. Coordination and gait normal. GCS eye subscore is 4. GCS verbal subscore is 5. GCS motor subscore is 6.  Skin: Skin is warm and dry. No rash noted.  Psychiatric: She has a normal mood and affect. Her behavior is normal.    ED Course  Procedures (including critical care time) Labs Review Labs Reviewed  CBC WITH DIFFERENTIAL - Abnormal; Notable for the following:    Hemoglobin 10.1 (*)    HCT 30.6 (*)    MCV 77.7 (*)    MCH 25.6 (*)    All other components within normal limits  BASIC METABOLIC PANEL - Abnormal; Notable  for the following:    Glucose, Bld 116 (*)    All other components within normal limits  URINALYSIS, ROUTINE W REFLEX MICROSCOPIC - Abnormal; Notable for the following:    APPearance CLOUDY (*)    All other components within normal limits  PREGNANCY, URINE    Imaging Review Dg Chest 2 View  09/12/2014   CLINICAL DATA:  Cough and fever.  Fatigue for 1 week.  EXAM: CHEST  2 VIEW  COMPARISON:  08/07/2014  FINDINGS: Mildly low lung volumes.  Airway thickening is present, suggesting bronchitis or reactive airways disease. Mildly enlarged cardiopericardial silhouette.  No pleural effusion or airspace opacity.  IMPRESSION: 1. Airway thickening is present, suggesting bronchitis or reactive airways disease. 2. Mild cardiomegaly without edema.   Electronically Signed   By: Herbie Baltimore M.D.   On: 09/12/2014 16:23  All radiology studies independently viewed by me.      EKG Interpretation   Date/Time:  Wednesday September 12 2014 16:10:03 EDT Ventricular Rate:  96 PR Interval:  196 QRS Duration: 86 QT Interval:  366 QTC Calculation: 462 R Axis:   66 Text Interpretation:  Normal sinus rhythm Normal ECG No significant change  was found Confirmed by [facility]   MD, TREY ([id number]) on 09/12/2014 4:20:21 PM      MDM   Final diagnoses:  Acute intractable headache, unspecified headache type  Acute URI    33 year old female presenting with headache, which she reports is consistent with her prior migraines.  She also complains of generalized fatigue, myalgias, and cough. On exam, appears uncomfortable but nontoxic, normal neurologic exam. Labs unremarkable including no leukocytosis. Headache was gradual onset over a few days.  No meningeal signs on exam.    After treatment in ED, she felt somewhat better. She appeared stable for discharge home.  History and workup were not indicative of SAH or meningitis.  Symptoms likely secondary to viral URI triggering her typical migraine headache.  She was given return precautions.    Candyce Churn III, MD 09/12/14 [id number]

## 2014-09-12 NOTE — ED Notes (Signed)
MD at bedside. 

## 2014-09-12 NOTE — ED Notes (Signed)
Reports mha x 3 days. Also cough and "feeling hot"

## 2014-09-12 NOTE — Discharge Instructions (Signed)
Migraine Headache °A migraine headache is an intense, throbbing pain on one or both sides of your head. A migraine can last for 30 minutes to several hours. °CAUSES  °The exact cause of a migraine headache is not always known. However, a migraine may be caused when nerves in the brain become irritated and release chemicals that cause inflammation. This causes pain. °Certain things may also trigger migraines, such as: °· Alcohol. °· Smoking. °· Stress. °· Menstruation. °· Aged cheeses. °· Foods or drinks that contain nitrates, glutamate, aspartame, or tyramine. °· Lack of sleep. °· Chocolate. °· Caffeine. °· Hunger. °· Physical exertion. °· Fatigue. °· Medicines used to treat chest pain (nitroglycerine), birth control pills, estrogen, and some blood pressure medicines. °SIGNS AND SYMPTOMS °· Pain on one or both sides of your head. °· Pulsating or throbbing pain. °· Severe pain that prevents daily activities. °· Pain that is aggravated by any physical activity. °· Nausea, vomiting, or both. °· Dizziness. °· Pain with exposure to bright lights, loud noises, or activity. °· General sensitivity to bright lights, loud noises, or smells. °Before you get a migraine, you may get warning signs that a migraine is coming (aura). An aura may include: °· Seeing flashing lights. °· Seeing bright spots, halos, or zigzag lines. °· Having tunnel vision or blurred vision. °· Having feelings of numbness or tingling. °· Having trouble talking. °· Having muscle weakness. °DIAGNOSIS  °A migraine headache is often diagnosed based on: °· Symptoms. °· Physical exam. °· A CT scan or MRI of your head. These imaging tests cannot diagnose migraines, but they can help rule out other causes of headaches. °TREATMENT °Medicines may be given for pain and nausea. Medicines can also be given to help prevent recurrent migraines.  °HOME CARE INSTRUCTIONS °· Only take over-the-counter or prescription medicines for pain or discomfort as directed by your  health care provider. The use of long-term narcotics is not recommended. °· Lie down in a dark, quiet room when you have a migraine. °· Keep a journal to find out what may trigger your migraine headaches. For example, write down: °· What you eat and drink. °· How much sleep you get. °· Any change to your diet or medicines. °· Limit alcohol consumption. °· Quit smoking if you smoke. °· Get 7-9 hours of sleep, or as recommended by your health care provider. °· Limit stress. °· Keep lights dim if bright lights bother you and make your migraines worse. °SEEK IMMEDIATE MEDICAL CARE IF:  °· Your migraine becomes severe. °· You have a fever. °· You have a stiff neck. °· You have vision loss. °· You have muscular weakness or loss of muscle control. °· You start losing your balance or have trouble walking. °· You feel faint or pass out. °· You have severe symptoms that are different from your first symptoms. °MAKE SURE YOU:  °· Understand these instructions. °· Will watch your condition. °· Will get help right away if you are not doing well or get worse. °Document Released: 12/07/2005 Document Revised: 04/23/2014 Document Reviewed: 08/14/2013 °[employer]® Patient Information ©2015 [employer]. This information is not intended to replace advice given to you by your health care provider. Make sure you discuss any questions you have with your health care provider. ° °Upper Respiratory Infection, Adult °An upper respiratory infection (URI) is also sometimes known as the common cold. The upper respiratory tract includes the nose, sinuses, throat, trachea, and bronchi. Bronchi are the airways leading to the lungs. Most people   improve within 1 week, but symptoms can last up to 2 weeks. A residual cough may last even longer.  CAUSES Many different viruses can infect the tissues lining the upper respiratory tract. The tissues become irritated and inflamed and often become very moist. Mucus production is also common. A cold is  contagious. You can easily spread the virus to others by oral contact. This includes kissing, sharing a glass, coughing, or sneezing. Touching your mouth or nose and then touching a surface, which is then touched by another person, can also spread the virus. SYMPTOMS  Symptoms typically develop 1 to 3 days after you come in contact with a cold virus. Symptoms vary from person to person. They may include:  Runny nose.  Sneezing.  Nasal congestion.  Sinus irritation.  Sore throat.  Loss of voice (laryngitis).  Cough.  Fatigue.  Muscle aches.  Loss of appetite.  Headache.  Low-grade fever. DIAGNOSIS  You might diagnose your own cold based on familiar symptoms, since most people get a cold 2 to 3 times a year. Your caregiver can confirm this based on your exam. Most importantly, your caregiver can check that your symptoms are not due to another disease such as strep throat, sinusitis, pneumonia, asthma, or epiglottitis. Blood tests, throat tests, and X-rays are not necessary to diagnose a common cold, but they may sometimes be helpful in excluding other more serious diseases. Your caregiver will decide if any further tests are required. RISKS AND COMPLICATIONS  You may be at risk for a more severe case of the common cold if you smoke cigarettes, have chronic heart disease (such as heart failure) or lung disease (such as asthma), or if you have a weakened immune system. The very young and very old are also at risk for more serious infections. Bacterial sinusitis, middle ear infections, and bacterial pneumonia can complicate the common cold. The common cold can worsen asthma and chronic obstructive pulmonary disease (COPD). Sometimes, these complications can require emergency medical care and may be life-threatening. PREVENTION  The best way to protect against getting a cold is to practice good hygiene. Avoid oral or hand contact with people with cold symptoms. Wash your hands often if  contact occurs. There is no clear evidence that vitamin C, vitamin E, echinacea, or exercise reduces the chance of developing a cold. However, it is always recommended to get plenty of rest and practice good nutrition. TREATMENT  Treatment is directed at relieving symptoms. There is no cure. Antibiotics are not effective, because the infection is caused by a virus, not by bacteria. Treatment may include:  Increased fluid intake. Sports drinks offer valuable electrolytes, sugars, and fluids.  Breathing heated mist or steam (vaporizer or shower).  Eating chicken soup or other clear broths, and maintaining good nutrition.  Getting plenty of rest.  Using gargles or lozenges for comfort.  Controlling fevers with ibuprofen or acetaminophen as directed by your caregiver.  Increasing usage of your inhaler if you have asthma. Zinc gel and zinc lozenges, taken in the first 24 hours of the common cold, can shorten the duration and lessen the severity of symptoms. Pain medicines may help with fever, muscle aches, and throat pain. A variety of non-prescription medicines are available to treat congestion and runny nose. Your caregiver can make recommendations and may suggest nasal or lung inhalers for other symptoms.  HOME CARE INSTRUCTIONS   Only take over-the-counter or prescription medicines for pain, discomfort, or fever as directed by your caregiver.  Use a  warm mist humidifier or inhale steam from a shower to increase air moisture. This may keep secretions moist and make it easier to breathe.  Drink enough water and fluids to keep your urine clear or pale yellow.  Rest as needed.  Return to work when your temperature has returned to normal or as your caregiver advises. You may need to stay home longer to avoid infecting others. You can also use a face mask and careful hand washing to prevent spread of the virus. SEEK MEDICAL CARE IF:   After the first few days, you feel you are getting worse  rather than better.  You need your caregiver's advice about medicines to control symptoms.  You develop chills, worsening shortness of breath, or brown or red sputum. These may be signs of pneumonia.  You develop yellow or brown nasal discharge or pain in the face, especially when you bend forward. These may be signs of sinusitis.  You develop a fever, swollen neck glands, pain with swallowing, or white areas in the back of your throat. These may be signs of strep throat. SEEK IMMEDIATE MEDICAL CARE IF:   You have a fever.  You develop severe or persistent headache, ear pain, sinus pain, or chest pain.  You develop wheezing, a prolonged cough, cough up blood, or have a change in your usual mucus (if you have chronic lung disease).  You develop sore muscles or a stiff neck. Document Released: 06/02/2001 Document Revised: 02/29/2012 Document Reviewed: 03/14/2014 [facility] Patient Information 2015 [place], [place]. This information is not intended to replace advice given to you by your health care provider. Make sure you discuss any questions you have with your health care provider.

## 2018-08-11 ENCOUNTER — Encounter (HOSPITAL_BASED_OUTPATIENT_CLINIC_OR_DEPARTMENT_OTHER): Payer: Self-pay | Admitting: Emergency Medicine

## 2018-08-11 ENCOUNTER — Emergency Department (HOSPITAL_BASED_OUTPATIENT_CLINIC_OR_DEPARTMENT_OTHER): Payer: Medicare Other

## 2018-08-11 ENCOUNTER — Emergency Department (HOSPITAL_BASED_OUTPATIENT_CLINIC_OR_DEPARTMENT_OTHER)
Admission: EM | Admit: 2018-08-11 | Discharge: 2018-08-11 | Disposition: A | Discharge status: Home / Self Care | Payer: Medicare Other | Attending: Emergency Medicine | Admitting: Emergency Medicine

## 2018-08-11 ENCOUNTER — Other Ambulatory Visit: Payer: Self-pay

## 2018-08-11 DIAGNOSIS — R079 Chest pain, unspecified: Secondary | ICD-10-CM

## 2018-08-11 DIAGNOSIS — I509 Heart failure, unspecified: Secondary | ICD-10-CM | POA: Diagnosis not present

## 2018-08-11 DIAGNOSIS — R0600 Dyspnea, unspecified: Secondary | ICD-10-CM | POA: Diagnosis not present

## 2018-08-11 DIAGNOSIS — Z86711 Personal history of pulmonary embolism: Secondary | ICD-10-CM | POA: Diagnosis not present

## 2018-08-11 DIAGNOSIS — R110 Nausea: Secondary | ICD-10-CM | POA: Insufficient documentation

## 2018-08-11 DIAGNOSIS — I110 Hypertensive heart disease with heart failure: Secondary | ICD-10-CM | POA: Insufficient documentation

## 2018-08-11 DIAGNOSIS — R0789 Other chest pain: Secondary | ICD-10-CM | POA: Insufficient documentation

## 2018-08-11 DIAGNOSIS — Z79899 Other long term (current) drug therapy: Secondary | ICD-10-CM | POA: Diagnosis not present

## 2018-08-11 LAB — BASIC METABOLIC PANEL
ANION GAP: 12 (ref 5–15)
BUN: 19 mg/dL (ref 6–20)
CHLORIDE: 99 mmol/L (ref 98–111)
CO2: 25 mmol/L (ref 22–32)
CREATININE: 1.26 mg/dL — AB (ref 0.44–1.00)
Calcium: 9.2 mg/dL (ref 8.9–10.3)
GFR calc non Af Amer: 53 mL/min — ABNORMAL LOW (ref >60)
Glucose, Bld: 146 mg/dL — ABNORMAL HIGH (ref 70–99)
POTASSIUM: 3.6 mmol/L (ref 3.5–5.1)
Sodium: 136 mmol/L (ref 135–145)

## 2018-08-11 LAB — TROPONIN I: Troponin I: <0.03 ng/mL (ref <0.03)

## 2018-08-11 LAB — BRAIN NATRIURETIC PEPTIDE: B NATRIURETIC PEPTIDE 5: 11.1 pg/mL (ref 0.0–100.0)

## 2018-08-11 LAB — CBC
HEMATOCRIT: 31.2 % — AB (ref 36.0–46.0)
HEMOGLOBIN: 10.3 g/dL — AB (ref 12.0–15.0)
MCH: 26.9 pg (ref 26.0–34.0)
MCHC: 33 g/dL (ref 30.0–36.0)
MCV: 81.5 fL (ref 78.0–100.0)
PLATELETS: 325 10*3/uL (ref 150–400)
RBC: 3.83 MIL/uL — AB (ref 3.87–5.11)
RDW: 16.3 % — ABNORMAL HIGH (ref 11.5–15.5)
WBC: 9.7 10*3/uL (ref 4.0–10.5)

## 2018-08-11 LAB — PREGNANCY, URINE: Preg Test, Ur: NEGATIVE

## 2018-08-11 MED ORDER — IOPAMIDOL (ISOVUE-370) INJECTION 76%
100.0000 mL | Freq: Once | INTRAVENOUS | Status: AC | PRN
  Administered 2018-08-11: 100 mL via INTRAVENOUS

## 2018-08-11 MED ORDER — FENTANYL CITRATE (PF) 100 MCG/2ML IJ SOLN
100.0000 ug | Freq: Once | INTRAMUSCULAR | Status: AC
  Administered 2018-08-11: 100 ug via INTRAVENOUS
  Filled 2018-08-11: qty 2

## 2018-08-11 MED ORDER — FENTANYL CITRATE (PF) 100 MCG/2ML IJ SOLN
50.0000 ug | Freq: Once | INTRAMUSCULAR | Status: AC
  Administered 2018-08-11: 50 ug via INTRAVENOUS
  Filled 2018-08-11: qty 2

## 2018-08-11 NOTE — ED Provider Notes (Signed)
[facility] EMERGENCY DEPARTMENT Provider Note   CSN: [id number] Arrival date & time: 08/11/18  1138     History   Chief Complaint Chief Complaint  Patient presents with  . Chest Pain    HPI Laural A Vicens is a 37 y.o. female past medical history of CHF, Crohn's, diabetes, fibromyalgia, hypertension, pulmonary embolism who presents for evaluation of chest pain and shortness of breath that is been ongoing since yesterday.  She describes the chest pain as a constant, "sharp pain across her chest."  He states she has got nauseous with the pain but denies any diaphoresis.  She states that the pain is worse with deep inspiration.  Her pain is not worsened by any exertional activity.  She states that the shortness of breath has been constant also.  She states that it does not matter what she is doing she will have difficulty breathing and feels like she cannot "take a full deep breath and."  She states that it is worse if she tries to walk around and exert herself.  Patient reports that she has a history of pulmonary embolism in 2008.  At that time, she was taking fertility medication and it was thought that her PE was secondary to those medications.  She was on blood thinners for several years but states that she was taken off of home.  She states that current symptoms feel similar to her previous episodes of PE.  Patient states she is not currently on any OCPs or fertility medications.  Denies any recent long plane, train rides.  She has not had any recent surgeries or hospitalizations.  She denies any recent long trips.  Patient reports a history of hypertension.  She states she had gestational diabetes but no other diabetes.  She reports no personal cardiac history.  Reports that had a heart attack at age 69.  She does not smoke.  Denies any IV drug use or cocaine use.  Patient denies any fevers, cough, abdominal pain, vomiting.  The history is provided by the patient.    Past Medical  History:  Diagnosis Date  . Abdominal pain, unspecified site   . CHF (congestive heart failure) (HCC)   . Chronic pain   . Crohn disease (HCC)   . Diabetes mellitus   . Fibromyalgia   . Gastric ulcer   . Hypertension   . Migraine   . Pancreatitis   . Polycystic ovarian disease   . Pulmonary embolism (HCC)   . Ulcer of ileum     There are no active problems to display for this patient.   Past Surgical History:  Procedure Laterality Date  . BREAST SURGERY     cyst  . CHOLECYSTECTOMY    . TONSILLECTOMY       OB History   None      Home Medications    Prior to Admission medications   Medication Sig Start Date End Date Taking? Authorizing Provider  levothyroxine (SYNTHROID, LEVOTHROID) 175 MCG tablet Take 175 mcg by mouth daily before breakfast.   Yes [provider]  zolpidem (AMBIEN) 10 MG tablet Take 10 mg by mouth at bedtime as needed for sleep.   Yes [provider]  acetaminophen (TYLENOL) 500 MG tablet Take 1,000 mg by mouth every 6 (six) hours as needed. For pain    [provider]  carvedilol (COREG) 25 MG tablet Take 25 mg by mouth 2 (two) times daily with a meal.    [provider]  cephALEXin (KEFLEX) 500 MG capsule Take 1 capsule (500 mg total) by mouth 4 (four) times daily. 02/05/14   Geoffery Lyons, MD  cloNIDine (CATAPRES) 0.1 MG tablet Take 0.1 mg by mouth daily as needed. For high blood pressure    [provider]  diazepam (VALIUM) 5 MG tablet Take 5 mg by mouth 2 (two) times daily as needed. For anxiety or back pain    [provider]  hydrochlorothiazide (HYDRODIURIL) 25 MG tablet Take 25 mg by mouth daily.    [provider]  HYDROcodone-acetaminophen (NORCO) 5-325 MG per tablet Take 2 tablets by mouth every 4 (four) hours as needed for pain. 05/15/13   Geoffery Lyons, MD  HYDROcodone-acetaminophen (NORCO/VICODIN) 5-325 MG per tablet Take 2 tablets by mouth every 4 (four) hours as needed. 01/24/14    Elson Areas, PA-C  ibuprofen (ADVIL,MOTRIN) 200 MG tablet Take 400 mg by mouth every 6 (six) hours as needed.    [provider]  lactulose (CEPHULAC) 10 G packet Take 10 g by mouth 3 (three) times daily.    [provider]  mesalamine (ASACOL) 400 MG EC tablet Take 800 mg by mouth daily.    [provider]  Olmesartan-Amlodipine-HCTZ (TRIBENZOR) 40-5-25 MG TABS Take 1 tablet by mouth daily.      [provider]  oxyCODONE-acetaminophen (PERCOCET) 10-325 MG per tablet Take 1 tablet by mouth every 4 (four) hours as needed for pain.    [provider]  Pediatric Multiple Vitamins (CHEWABLE MULTIPLE VITAMINS PO) Take 1 tablet by mouth daily.    [provider]  potassium chloride (K-DUR) 10 MEQ tablet Take 1 tablet (10 mEq total) by mouth 2 (two) times daily. 08/07/14   Vanetta Mulders, MD  potassium chloride (K-DUR) 10 MEQ tablet Take 1 tablet (10 mEq total) by mouth daily. 08/07/14   Vanetta Mulders, MD  promethazine (PHENERGAN) 25 MG tablet Take 1 tablet (25 mg total) by mouth every 6 (six) hours as needed for nausea. 05/15/13   Geoffery Lyons, MD  promethazine (PHENERGAN) 25 MG tablet Take 1 tablet (25 mg total) by mouth every 6 (six) hours as needed for nausea or vomiting. 01/24/14   Elson Areas, PA-C  promethazine (PHENERGAN) 25 MG tablet Take 1 tablet (25 mg total) by mouth every 6 (six) hours as needed for nausea. 02/05/14   Geoffery Lyons, MD  traMADol (ULTRAM) 50 MG tablet Take 1 tablet (50 mg total) by mouth every 6 (six) hours as needed. 02/05/14   Geoffery Lyons, MD  Vitamin D, Ergocalciferol, (DRISDOL) 50000 UNITS CAPS Take 50,000 Units by mouth every 7 (seven) days. On Tuesday    [provider]    Family History History reviewed. No pertinent family history.  Social History Social History   Tobacco Use  . Smoking status: Never Smoker  . Smokeless tobacco: Never Used  Substance Use Topics  . Alcohol use: No  .  Drug use: No     Allergies   Caffeine; Compazine; Decadron [dexamethasone sodium phosphate]; Flagyl [metronidazole hcl]; Metoclopramide hcl; and Zofran   Review of Systems Review of Systems  Constitutional: Negative for fever.  Respiratory: Positive for shortness of breath. Negative for cough.   Cardiovascular: Positive for chest pain.  Gastrointestinal: Negative for abdominal pain, nausea and vomiting.  Genitourinary: Negative for dysuria and hematuria.  Neurological: Negative for headaches.  All other systems reviewed and are negative.    Physical Exam Updated Vital Signs BP 113/85   Pulse 84  Temp 98.4 F (36.9 C) (Oral)   Resp 16   Ht 5\' 6"  (1.676 m)   Wt 99.8 kg   LMP 08/04/2018 (Approximate)   SpO2 99%   BMI 35.51 kg/m   Physical Exam  Constitutional: She is oriented to person, place, and time. She appears well-developed and well-nourished.  Appears uncomfortable  HENT:  Head: Normocephalic and atraumatic.  Mouth/Throat: Oropharynx is clear and moist and mucous membranes are normal.  Eyes: Pupils are equal, round, and reactive to light. Conjunctivae, EOM and lids are normal.  Neck: Full passive range of motion without pain.  Cardiovascular: Normal rate, regular rhythm, normal heart sounds and normal pulses. Exam reveals no gallop and no friction rub.  No murmur heard. Pulses:      Radial pulses are 2+ on the right side, and 2+ on the left side.       Dorsalis pedis pulses are 2+ on the right side, and 2+ on the left side.  Pulmonary/Chest: Breath sounds normal. She has no decreased breath sounds.  Mild increased work of breathing.  Speaking in short sentences.  No wheezing, decreased breath sounds.  Abdominal: Soft. Normal appearance. There is no tenderness. There is no rigidity and no guarding.  Musculoskeletal: Normal range of motion.  Neurological: She is alert and oriented to person, place, and time.  Skin: Skin is warm and dry. Capillary refill takes  less than 2 seconds.  Psychiatric: She has a normal mood and affect. Her speech is normal.  Nursing note and vitals reviewed.    ED Treatments / Results  Labs (all labs ordered are listed, but only abnormal results are displayed) Labs Reviewed  BASIC METABOLIC PANEL - Abnormal; Notable for the following components:      Result Value   Glucose, Bld 146 (*)    Creatinine, Ser 1.26 (*)    GFR calc non Af Amer 53 (*)    All other components within normal limits  CBC - Abnormal; Notable for the following components:   RBC 3.83 (*)    Hemoglobin 10.3 (*)    HCT 31.2 (*)    RDW 16.3 (*)    All other components within normal limits  TROPONIN I  PREGNANCY, URINE  BRAIN NATRIURETIC PEPTIDE    EKG EKG Interpretation  Date/Time:  Thursday August 11 2018 11:49:11 EDT Ventricular Rate:  74 PR Interval:  232 QRS Duration: 96 QT Interval:  402 QTC Calculation: 446 R Axis:   83 Text Interpretation:  Sinus rhythm with 1st degree A-V block Otherwise normal ECG since last tracing no significant change Confirmed by Rolan Bucco [phone]) on 08/11/2018 2:41:37 PM   Radiology Dg Chest 2 View  Result Date: 08/11/2018 CLINICAL DATA:  Chest pain, shortness of breath, upper back pain EXAM: CHEST - 2 VIEW COMPARISON:  Chest x-ray of 05/02/2018 FINDINGS: No active infiltrate or effusion is seen. Mediastinal and hilar contours are unchanged. The heart is within normal limits in size. No bony abnormality is seen. IMPRESSION: No active cardiopulmonary disease. Electronically Signed   By: Dwyane Dee M.D.   On: 08/11/2018 12:38   Ct Angio Chest Pe W And/or Wo Contrast  Result Date: 08/11/2018 CLINICAL DATA:  Shortness of breath and chest pain EXAM: CT ANGIOGRAPHY CHEST WITH CONTRAST TECHNIQUE: Multidetector CT imaging of the chest was performed using the standard protocol during bolus administration of intravenous contrast. Multiplanar CT image reconstructions and MIPs were obtained to evaluate the  vascular anatomy. CONTRAST:  100 mL ISOVUE-370 IOPAMIDOL (ISOVUE-370)  INJECTION 76% COMPARISON:  CT angiogram chest June 29, 2017; CT angiogram chest May 04, 2016; CT angiogram chest February 27, 2014 chest radiograph August 11, 2018 FINDINGS: Cardiovascular: There is no demonstrable pulmonary embolus. There is no thoracic aortic aneurysm or dissection. Visualized great vessels appear unremarkable. No pericardial effusion or pericardial thickening. There is prominence of the main pulmonary outflow tract measuring 3.4 cm in diameter. Mediastinum/Nodes: Thyroid gland not well delineated, similar to most recent prior study. There is a stable prominent lymph node in the left subpectoral region measuring 1.2 x 1.2 cm. There is a right subpectoral lymph node measuring 1.1 x 0.9 cm. There are several borderline prominent axillary lymph nodes. No adenopathy is noted elsewhere in the thoracic region. No esophageal lesions are evident. Lungs/Pleura: There are fairly small free-flowing pleural effusions bilaterally. There is slight bibasilar atelectasis. There is no appreciable edema or consolidation. Upper Abdomen: Visualized upper abdominal structures appear unremarkable except for surgical absence of the gallbladder. Musculoskeletal: There are no blastic or lytic bone lesions. No chest wall lesions are evident. Review of the MIP images confirms the above findings. IMPRESSION: 1. No demonstrable pulmonary embolus. No thoracic aortic aneurysm or dissection. 2. Prominence of the main pulmonary outflow tract, a finding felt to be indicative of a degree of pulmonary arterial hypertension. 3. Free-flowing pleural effusions bilaterally, fairly small. Mild bibasilar atelectasis. No consolidation. 4. Chronic mildly prominent subpectoral and borderline prominent axillary lymph nodes. No new lymph node prominence. 5. Thyroid not well delineated currently. This appearance is similar to 1 year prior. On prior study from 2017, thyroid was  well delineated and prominent. Question whether patient has had treatment for enlarged thyroid in the interval since 2017. A degree of thyroiditis could present in this manner currently. Appropriate laboratory correlation may be advisable in this regard, depending on clinical history. Electronically Signed   By: Bretta Bang III M.D.   On: 08/11/2018 13:54    Procedures Procedures (including critical care time)  Medications Ordered in ED Medications  fentaNYL (SUBLIMAZE) injection 100 mcg (100 mcg Intravenous Given 08/11/18 1302)  iopamidol (ISOVUE-370) 76 % injection 100 mL (100 mLs Intravenous Contrast Given 08/11/18 1314)  fentaNYL (SUBLIMAZE) injection 50 mcg (50 mcg Intravenous Given 08/11/18 1419)     Initial Impression / Assessment and Plan / ED Course  I have reviewed the triage vital signs and the nursing notes.  Pertinent labs & imaging results that were available during my care of the patient were reviewed by me and considered in my medical decision making (see chart for details).     37 y.o. F with PMH/o CHF, Crohn's, diabetes, fibromyalgia, hypertension, pulmonary embolism who presents for evaluation of wrist pain and shortness of breath that began yesterday. Patient reports that shortness of breath is worse with exertion.  Chest pain is worse with deep inspiration.  Reorts she has a history of PE in 2008.  Was felt that it was because of fertility medications that she was taking.  Is not currently on any OCPs, steroids, fertility meds. Patient is afebrile, non-toxic appearing, sitting comfortably on examination table. Vital signs reviewed and stable.  Consider ACS etiology versus infectious etiology.  Also consider CHF exacerbation and PE given patient's history.  Initial labs ordered at triage.  Will get chest x-ray and EKG.  Additionally, given patient's history, will plan for CT of chest for evaluation of PE.  BMP shows creatinine of 1.26.  Otherwise unremarkable.  CBC shows  hemoglobin is 10.3, hematocrit is 31.2.  This is consistent with patient's baseline.  BNP is 11.1. Trop negative.  Since pain is been constant for the last 24 hours.  Given this, one negative troponin is sufficient for ACS rule out.  Given patient's description and presentation of pain, do not suspect cardiac etiology.  X-ray unremarkable for any acute infectious etiology.  CTA shows no evidence of PE.  No evidence of thoracic aneurysm or dissection.  There is mention of prominence of the main pulmonary outflow tract that could be active pulmonary arterial hypertension.  Small pleural effusions noted bilaterally.   Discussed results with patient.  Patient reports some improvement in pain.  Still feels like she is having some shortness of breath.  O2 sats are greater than 95% on room air.  We will plan to ambulate patient.  Patient ambulated in the ED with O2 sats 99 to 100%.  Vital signs were stable.  Patient stable for discharge at this time.  Instructed follow-up primary care doctor. Patient had ample opportunity for questions and discussion. All patient's questions were answered with full understanding. Strict return precautions discussed. Patient expresses understanding and agreement to plan.   Final Clinical Impressions(s) / ED Diagnoses   Final diagnoses:  Dyspnea, unspecified type  Chest pain, unspecified type    ED Discharge Orders    None       Maxwell Caul, PA-C 08/11/18 1613    Rolan Bucco, MD 08/15/18 2230

## 2018-08-11 NOTE — ED Triage Notes (Signed)
Reports chest pain with shortness of breath since yesterday.  Reports this is progressively getting worse.  States when breathing she has pain in her back.  History of PEs, reports this pain as similar.  Reports nausea.

## 2018-08-11 NOTE — ED Notes (Signed)
Patient transported to X-ray 

## 2018-08-11 NOTE — ED Notes (Signed)
Pt unable to provide urine at this time

## 2018-08-11 NOTE — ED Notes (Signed)
Ambulated on r/a  HR 88, SpO2 99-100%, RR 18-20. Endorses mild SOB

## 2018-08-11 NOTE — Discharge Instructions (Signed)
Your work-up today was reassuring.  Your CT scan showed no evidence of pulmonary embolism.  Please follow-up with your doctor in the next 2 to 4 days for further evaluation.  Return to emergency department for any worsening chest pain, difficulty breathing, abdominal pain, nausea/vomiting, fevers or any other worsening or concerning symptoms.

## 2019-12-02 ENCOUNTER — Other Ambulatory Visit: Payer: Self-pay

## 2019-12-02 ENCOUNTER — Emergency Department (HOSPITAL_BASED_OUTPATIENT_CLINIC_OR_DEPARTMENT_OTHER)
Admission: EM | Admit: 2019-12-02 | Discharge: 2019-12-03 | Disposition: A | Discharge status: Home / Self Care | Payer: Medicare Other | Attending: Emergency Medicine | Admitting: Emergency Medicine

## 2019-12-02 ENCOUNTER — Encounter (HOSPITAL_BASED_OUTPATIENT_CLINIC_OR_DEPARTMENT_OTHER): Payer: Self-pay | Admitting: *Deleted

## 2019-12-02 DIAGNOSIS — Z79899 Other long term (current) drug therapy: Secondary | ICD-10-CM | POA: Insufficient documentation

## 2019-12-02 DIAGNOSIS — E119 Type 2 diabetes mellitus without complications: Secondary | ICD-10-CM | POA: Insufficient documentation

## 2019-12-02 DIAGNOSIS — M79605 Pain in left leg: Secondary | ICD-10-CM | POA: Diagnosis not present

## 2019-12-02 DIAGNOSIS — M79604 Pain in right leg: Secondary | ICD-10-CM | POA: Diagnosis not present

## 2019-12-02 DIAGNOSIS — R0602 Shortness of breath: Secondary | ICD-10-CM | POA: Diagnosis present

## 2019-12-02 DIAGNOSIS — J90 Pleural effusion, not elsewhere classified: Secondary | ICD-10-CM | POA: Diagnosis not present

## 2019-12-02 DIAGNOSIS — I509 Heart failure, unspecified: Secondary | ICD-10-CM | POA: Diagnosis not present

## 2019-12-02 DIAGNOSIS — I110 Hypertensive heart disease with heart failure: Secondary | ICD-10-CM | POA: Insufficient documentation

## 2019-12-02 NOTE — ED Triage Notes (Signed)
Pt reports 1 week of shortness of breath, leg swelling, and chest pain. Hx of blood clot and states these Sx are similar

## 2019-12-03 ENCOUNTER — Emergency Department (HOSPITAL_BASED_OUTPATIENT_CLINIC_OR_DEPARTMENT_OTHER): Payer: Medicare Other

## 2019-12-03 ENCOUNTER — Ambulatory Visit (HOSPITAL_BASED_OUTPATIENT_CLINIC_OR_DEPARTMENT_OTHER)
Admit: 2019-12-03 | Discharge: 2019-12-03 | Disposition: A | Discharge status: Home / Self Care | Payer: Medicare Other | Attending: Emergency Medicine | Admitting: Emergency Medicine

## 2019-12-03 ENCOUNTER — Encounter (HOSPITAL_BASED_OUTPATIENT_CLINIC_OR_DEPARTMENT_OTHER): Payer: Self-pay

## 2019-12-03 ENCOUNTER — Other Ambulatory Visit (HOSPITAL_BASED_OUTPATIENT_CLINIC_OR_DEPARTMENT_OTHER): Payer: Medicare Other

## 2019-12-03 DIAGNOSIS — J90 Pleural effusion, not elsewhere classified: Secondary | ICD-10-CM | POA: Diagnosis not present

## 2019-12-03 DIAGNOSIS — M79605 Pain in left leg: Secondary | ICD-10-CM

## 2019-12-03 LAB — CBC WITH DIFFERENTIAL/PLATELET
Abs Immature Granulocytes: 0.01 10*3/uL (ref 0.00–0.07)
Basophils Absolute: 0.1 10*3/uL (ref 0.0–0.1)
Basophils Relative: 1 %
Eosinophils Absolute: 0.4 10*3/uL (ref 0.0–0.5)
Eosinophils Relative: 4 %
HCT: 31.4 % — ABNORMAL LOW (ref 36.0–46.0)
Hemoglobin: 9.9 g/dL — ABNORMAL LOW (ref 12.0–15.0)
Immature Granulocytes: 0 %
Lymphocytes Relative: 42 %
Lymphs Abs: 4.4 10*3/uL — ABNORMAL HIGH (ref 0.7–4.0)
MCH: 26.1 pg (ref 26.0–34.0)
MCHC: 31.5 g/dL (ref 30.0–36.0)
MCV: 82.8 fL (ref 80.0–100.0)
Monocytes Absolute: 0.7 10*3/uL (ref 0.1–1.0)
Monocytes Relative: 6 %
Neutro Abs: 5.1 10*3/uL (ref 1.7–7.7)
Neutrophils Relative %: 47 %
Platelets: 275 10*3/uL (ref 150–400)
RBC: 3.79 MIL/uL — ABNORMAL LOW (ref 3.87–5.11)
RDW: 15.4 % (ref 11.5–15.5)
WBC: 10.4 10*3/uL (ref 4.0–10.5)
nRBC: 0 % (ref 0.0–0.2)

## 2019-12-03 LAB — BASIC METABOLIC PANEL
Anion gap: 9 (ref 5–15)
BUN: 10 mg/dL (ref 6–20)
CO2: 25 mmol/L (ref 22–32)
Calcium: 9 mg/dL (ref 8.9–10.3)
Chloride: 102 mmol/L (ref 98–111)
Creatinine, Ser: 0.74 mg/dL (ref 0.44–1.00)
GFR calc Af Amer: >60 mL/min (ref >60)
GFR calc non Af Amer: >60 mL/min (ref >60)
Glucose, Bld: 117 mg/dL — ABNORMAL HIGH (ref 70–99)
Potassium: 3.5 mmol/L (ref 3.5–5.1)
Sodium: 136 mmol/L (ref 135–145)

## 2019-12-03 LAB — PREGNANCY, URINE: Preg Test, Ur: NEGATIVE

## 2019-12-03 LAB — TROPONIN I (HIGH SENSITIVITY): Troponin I (High Sensitivity): <2 ng/L (ref <18)

## 2019-12-03 MED ORDER — ENOXAPARIN SODIUM 100 MG/ML ~~LOC~~ SOLN
1.0000 mg/kg | Freq: Once | SUBCUTANEOUS | Status: AC
  Administered 2019-12-03: 100 mg via SUBCUTANEOUS
  Filled 2019-12-03: qty 1

## 2019-12-03 MED ORDER — FUROSEMIDE 40 MG PO TABS
40.0000 mg | ORAL_TABLET | Freq: Once | ORAL | Status: AC
  Administered 2019-12-03: 40 mg via ORAL
  Filled 2019-12-03: qty 1

## 2019-12-03 MED ORDER — NAPROXEN 250 MG PO TABS
500.0000 mg | ORAL_TABLET | Freq: Once | ORAL | Status: AC
  Administered 2019-12-03: 500 mg via ORAL
  Filled 2019-12-03: qty 2

## 2019-12-03 MED ORDER — IOHEXOL 350 MG/ML SOLN
80.0000 mL | Freq: Once | INTRAVENOUS | Status: AC | PRN
  Administered 2019-12-03: 01:00:00 80 mL via INTRAVENOUS

## 2019-12-03 NOTE — ED Notes (Signed)
Taken to CT at this time. 

## 2019-12-03 NOTE — ED Notes (Signed)
ED Provider at bedside. 

## 2019-12-03 NOTE — ED Provider Notes (Signed)
[facility] EMERGENCY DEPARTMENT Provider Note   CSN: [id number] Arrival date & time: 12/02/19  2341     History Chief Complaint  Patient presents with  . Shortness of Breath    Kristi Simpson is a 38 y.o. female.  The history is provided by the patient.  Shortness of Breath Severity:  Moderate Onset quality:  Gradual Duration:  1 month Timing:  Constant Progression:  Unchanged Chronicity:  Recurrent Context: not activity, not animal exposure, not emotional upset and not fumes   Worsened by:  Nothing Ineffective treatments:  None tried Associated symptoms: no abdominal pain, no chest pain, no claudication, no fever, no hemoptysis, no sore throat, no sputum production, no syncope, no swollen glands, no vomiting and no wheezing   Associated symptoms comment:  Reports BLE pain for the same period of time Risk factors: no recent alcohol use, no prolonged immobilization and no recent surgery   Patient with a h/o chronic pain and fibromyalgia presents for one month of SOB and BLE pain without swelling.  Has seen her PMD who is working her up for something.  It has been the same for a month.  No cough, no orthopnea,  No edema.  No f/c/r.  No covid contacts.  No anosmia.       Past Medical History:  Diagnosis Date  . Abdominal pain, unspecified site   . CHF (congestive heart failure) (HCC)   . Chronic pain   . Crohn disease (HCC)   . Diabetes mellitus   . Fibromyalgia   . Gastric ulcer   . Hypertension   . Migraine   . Pancreatitis   . Polycystic ovarian disease   . Pulmonary embolism (HCC)   . Ulcer of ileum     There are no problems to display for this patient.   Past Surgical History:  Procedure Laterality Date  . BREAST SURGERY     cyst  . CHOLECYSTECTOMY    . TONSILLECTOMY       OB History   No obstetric history on file.     No family history on file.  Social History   Tobacco Use  . Smoking status: Never Smoker  . Smokeless tobacco:  Never Used  Substance Use Topics  . Alcohol use: No  . Drug use: No    Home Medications Prior to Admission medications   Medication Sig Start Date End Date Taking? Authorizing Provider  acetaminophen (TYLENOL) 500 MG tablet Take 1,000 mg by mouth every 6 (six) hours as needed. For pain    [provider]  carvedilol (COREG) 25 MG tablet Take 25 mg by mouth 2 (two) times daily with a meal.    [provider]  cephALEXin (KEFLEX) 500 MG capsule Take 1 capsule (500 mg total) by mouth 4 (four) times daily. 02/05/14   Geoffery Lyons, MD  cloNIDine (CATAPRES) 0.1 MG tablet Take 0.1 mg by mouth daily as needed. For high blood pressure    [provider]  diazepam (VALIUM) 5 MG tablet Take 5 mg by mouth 2 (two) times daily as needed. For anxiety or back pain    [provider]  hydrochlorothiazide (HYDRODIURIL) 25 MG tablet Take 25 mg by mouth daily.    [provider]  HYDROcodone-acetaminophen (NORCO) 5-325 MG per tablet Take 2 tablets by mouth every 4 (four) hours as needed for pain. 05/15/13   Geoffery Lyons, MD  HYDROcodone-acetaminophen (NORCO/VICODIN) 5-325 MG per tablet Take 2 tablets by mouth every 4 (four)  hours as needed. 01/24/14   Elson AreasSofia, Leslie K, PA-C  ibuprofen (ADVIL,MOTRIN) 200 MG tablet Take 400 mg by mouth every 6 (six) hours as needed.    [provider]  lactulose (CEPHULAC) 10 G packet Take 10 g by mouth 3 (three) times daily.    [provider]  levothyroxine (SYNTHROID, LEVOTHROID) 175 MCG tablet Take 175 mcg by mouth daily before breakfast.    [provider]  mesalamine (ASACOL) 400 MG EC tablet Take 800 mg by mouth daily.    [provider]  Olmesartan-Amlodipine-HCTZ (TRIBENZOR) 40-5-25 MG TABS Take 1 tablet by mouth daily.      [provider]  oxyCODONE-acetaminophen (PERCOCET) 10-325 MG per tablet Take 1 tablet by mouth every 4 (four) hours as needed for pain.    [provider]    Pediatric Multiple Vitamins (CHEWABLE MULTIPLE VITAMINS PO) Take 1 tablet by mouth daily.    [provider]  potassium chloride (K-DUR) 10 MEQ tablet Take 1 tablet (10 mEq total) by mouth 2 (two) times daily. 08/07/14   Vanetta MuldersZackowski, Scott, MD  potassium chloride (K-DUR) 10 MEQ tablet Take 1 tablet (10 mEq total) by mouth daily. 08/07/14   Vanetta MuldersZackowski, Scott, MD  promethazine (PHENERGAN) 25 MG tablet Take 1 tablet (25 mg total) by mouth every 6 (six) hours as needed for nausea. 05/15/13   Geoffery Lyonselo, Douglas, MD  promethazine (PHENERGAN) 25 MG tablet Take 1 tablet (25 mg total) by mouth every 6 (six) hours as needed for nausea or vomiting. 01/24/14   Elson AreasSofia, Leslie K, PA-C  promethazine (PHENERGAN) 25 MG tablet Take 1 tablet (25 mg total) by mouth every 6 (six) hours as needed for nausea. 02/05/14   Geoffery Lyonselo, Douglas, MD  traMADol (ULTRAM) 50 MG tablet Take 1 tablet (50 mg total) by mouth every 6 (six) hours as needed. 02/05/14   Geoffery Lyonselo, Douglas, MD  Vitamin D, Ergocalciferol, (DRISDOL) 50000 UNITS CAPS Take 50,000 Units by mouth every 7 (seven) days. On Tuesday    [provider]  zolpidem (AMBIEN) 10 MG tablet Take 10 mg by mouth at bedtime as needed for sleep.    [provider]    Allergies    Caffeine, Compazine, Decadron [dexamethasone sodium phosphate], Flagyl [metronidazole hcl], Metoclopramide hcl, and Zofran  Review of Systems   Review of Systems  Constitutional: Negative for fever.  HENT: Negative for sore throat.   Eyes: Negative for visual disturbance.  Respiratory: Positive for shortness of breath. Negative for hemoptysis, sputum production and wheezing.   Cardiovascular: Negative for chest pain, palpitations, claudication, leg swelling and syncope.  Gastrointestinal: Negative for abdominal pain and vomiting.  Genitourinary: Negative for difficulty urinating.  Musculoskeletal: Positive for arthralgias.  Neurological: Negative for dizziness.  Hematological: Negative for  adenopathy.  Psychiatric/Behavioral: Negative for agitation.  All other systems reviewed and are negative.   Physical Exam Updated Vital Signs BP (!) 136/101   Pulse 95   Temp 98.1 F (36.7 C) (Oral)   Resp 19   Ht 5' 5.5" (1.664 m)   Wt 99.8 kg   LMP 11/05/2019 Comment: neg preg test 12/03/19  SpO2 100%   BMI 36.05 kg/m   Physical Exam Vitals and nursing note reviewed.  Constitutional:      General: She is not in acute distress.    Appearance: Normal appearance.     Comments: Appears somnolent and falls asleep during history and physical and has pinpoint pupils  HENT:     Head: Normocephalic and atraumatic.  Nose: Nose normal.  Eyes:     Conjunctiva/sclera: Conjunctivae normal.     Comments: pinpoint  Cardiovascular:     Rate and Rhythm: Normal rate and regular rhythm.     Pulses: Normal pulses.     Heart sounds: Normal heart sounds.  Pulmonary:     Effort: Pulmonary effort is normal.     Breath sounds: Normal breath sounds.  Abdominal:     General: Abdomen is flat. Bowel sounds are normal.     Tenderness: There is no abdominal tenderness. There is no guarding or rebound.  Musculoskeletal:        General: No tenderness.     Cervical back: Normal range of motion and neck supple.     Right lower leg: No edema.     Left lower leg: No edema.     Comments: Negative Homan's Sign B  Skin:    General: Skin is warm.     Capillary Refill: Capillary refill takes less than 2 seconds.  Neurological:     General: No focal deficit present.     Mental Status: She is alert and oriented to person, place, and time.     Deep Tendon Reflexes: Reflexes normal.  Psychiatric:        Thought Content: Thought content normal.     ED Results / Procedures / Treatments   Labs (all labs ordered are listed, but only abnormal results are displayed) Results for orders placed or performed during the hospital encounter of 12/02/19  CBC with Differential  Result Value Ref Range   WBC  10.4 4.0 - 10.5 K/uL   RBC 3.79 (L) 3.87 - 5.11 MIL/uL   Hemoglobin 9.9 (L) 12.0 - 15.0 g/dL   HCT 31.4 (L) 36.0 - 46.0 %   MCV 82.8 80.0 - 100.0 fL   MCH 26.1 26.0 - 34.0 pg   MCHC 31.5 30.0 - 36.0 g/dL   RDW 15.4 11.5 - 15.5 %   Platelets 275 150 - 400 K/uL   nRBC 0.0 0.0 - 0.2 %   Neutrophils Relative % 47 %   Neutro Abs 5.1 1.7 - 7.7 K/uL   Lymphocytes Relative 42 %   Lymphs Abs 4.4 (H) 0.7 - 4.0 K/uL   Monocytes Relative 6 %   Monocytes Absolute 0.7 0.1 - 1.0 K/uL   Eosinophils Relative 4 %   Eosinophils Absolute 0.4 0.0 - 0.5 K/uL   Basophils Relative 1 %   Basophils Absolute 0.1 0.0 - 0.1 K/uL   WBC Morphology MORPHOLOGY UNREMARKABLE    Smear Review PLATELET COUNT CONFIRMED BY SMEAR    Immature Granulocytes 0 %   Abs Immature Granulocytes 0.01 0.00 - 0.07 K/uL   Acanthocytes PRESENT    Schistocytes PRESENT    Ovalocytes PRESENT    Giant PLTs PRESENT   Basic metabolic panel  Result Value Ref Range   Sodium 136 135 - 145 mmol/L   Potassium 3.5 3.5 - 5.1 mmol/L   Chloride 102 98 - 111 mmol/L   CO2 25 22 - 32 mmol/L   Glucose, Bld 117 (H) 70 - 99 mg/dL   BUN 10 6 - 20 mg/dL   Creatinine, Ser 0.74 0.44 - 1.00 mg/dL   Calcium 9.0 8.9 - 10.3 mg/dL   GFR calc non Af Amer >60 >60 mL/min   GFR calc Af Amer >60 >60 mL/min   Anion gap 9 5 - 15  Pregnancy, urine  Result Value Ref Range   Preg Test, Ur NEGATIVE NEGATIVE  Troponin I (High Sensitivity)  Result Value Ref Range   Troponin I (High Sensitivity) <2 <18 ng/L   CT Angio Chest PE W and/or Wo Contrast  Addendum Date: 12/03/2019   ADDENDUM REPORT: 12/03/2019 01:29 ADDENDUM: The impression should read: No evidence of pulmonary emboli. Small bilateral pleural effusions without acute infiltrate. Electronically Signed   By: Alcide CleverMark  Lukens M.D.   On: 12/03/2019 01:29   Result Date: 12/03/2019 CLINICAL DATA:  One week of shortness of breath EXAM: CT ANGIOGRAPHY CHEST WITH CONTRAST TECHNIQUE: Multidetector CT imaging of the  chest was performed using the standard protocol during bolus administration of intravenous contrast. Multiplanar CT image reconstructions and MIPs were obtained to evaluate the vascular anatomy. CONTRAST:  80mL OMNIPAQUE IOHEXOL 350 MG/ML SOLN COMPARISON:  08/11/2018 FINDINGS: Cardiovascular: Thoracic aorta demonstrates a normal branching pattern. No aneurysmal dilatation or dissection is noted. Mild cardiomegaly is noted. No coronary calcifications are seen. The pulmonary artery demonstrates a normal branching pattern. No definitive filling defect is identified to suggest pulmonary embolism. Mediastinum/Nodes: Thoracic inlet is within normal limits. The esophagus is unremarkable. No hilar or mediastinal adenopathy is noted. Lungs/Pleura: The lungs are well aerated bilaterally. Small bilateral pleural effusions are seen. No significant pulmonary edema is noted. No infiltrates are noted. No pulmonary nodules are noted. Upper Abdomen: Visualized upper abdomen is within normal limits. Musculoskeletal: Mild degenerative change of the thoracic spine is noted. No acute rib abnormality is seen. Review of the MIP images confirms the above findings. IMPRESSION: No evidence of pulmonary emboli. Small bilateral pleural effusions acute infiltrate. Electronically Signed: By: Alcide CleverMark  Lukens M.D. On: 12/03/2019 01:10    EKG   EKG Interpretation  Date/Time:  Saturday December 02 2019 23:54:19 EST Ventricular Rate:  101 PR Interval:    QRS Duration: 100 QT Interval:  359 QTC Calculation: 466 R Axis:   90 Text Interpretation: Sinus tachycardia Prolonged PR interval Baseline wander in lead(s) V2 Confirmed by Nicanor AlconPalumbo, Reshma Hoey ([id number]) on 12/03/2019 3:39:42 AM       Radiology CT Angio Chest PE W and/or Wo Contrast  Addendum Date: 12/03/2019   ADDENDUM REPORT: 12/03/2019 01:29 ADDENDUM: The impression should read: No evidence of pulmonary emboli. Small bilateral pleural effusions without acute infiltrate. Electronically  Signed   By: Alcide CleverMark  Lukens M.D.   On: 12/03/2019 01:29   Result Date: 12/03/2019 CLINICAL DATA:  One week of shortness of breath EXAM: CT ANGIOGRAPHY CHEST WITH CONTRAST TECHNIQUE: Multidetector CT imaging of the chest was performed using the standard protocol during bolus administration of intravenous contrast. Multiplanar CT image reconstructions and MIPs were obtained to evaluate the vascular anatomy. CONTRAST:  80mL OMNIPAQUE IOHEXOL 350 MG/ML SOLN COMPARISON:  08/11/2018 FINDINGS: Cardiovascular: Thoracic aorta demonstrates a normal branching pattern. No aneurysmal dilatation or dissection is noted. Mild cardiomegaly is noted. No coronary calcifications are seen. The pulmonary artery demonstrates a normal branching pattern. No definitive filling defect is identified to suggest pulmonary embolism. Mediastinum/Nodes: Thoracic inlet is within normal limits. The esophagus is unremarkable. No hilar or mediastinal adenopathy is noted. Lungs/Pleura: The lungs are well aerated bilaterally. Small bilateral pleural effusions are seen. No significant pulmonary edema is noted. No infiltrates are noted. No pulmonary nodules are noted. Upper Abdomen: Visualized upper abdomen is within normal limits. Musculoskeletal: Mild degenerative change of the thoracic spine is noted. No acute rib abnormality is seen. Review of the MIP images confirms the above findings. IMPRESSION: No evidence of pulmonary emboli. Small bilateral pleural effusions acute infiltrate. Electronically Signed: By: Loraine LericheMark  Lukens M.D. On: 12/03/2019 01:10    Procedures Procedures (including critical care time)  Medications Ordered in ED Medications  iohexol (OMNIPAQUE) 350 MG/ML injection 80 mL (80 mLs Intravenous Contrast Given 12/03/19 0052)  furosemide (LASIX) tablet 40 mg (40 mg Oral Given 12/03/19 0130)  enoxaparin (LOVENOX) injection 100 mg (100 mg Subcutaneous Given 12/03/19 0133)  naproxen (NAPROSYN) tablet 500 mg (500 mg Oral Given 12/03/19  0141)   Patient is in pain management with a high overdose score. And is somnolent.  We we not be giving narcotics ans the patient is too somnolent for this.     ED Course  Given time course the patient was ruled out for MI in the ED with negative EKG and troponin, heart score is 1.  Low risk for MACE.  Patient had a PE before 2013 but was on OCP at the time.  Ruled out for PE in the ED.  Given one dose of lasix for pleural effusions, and instructed to follow up with PMD for ongoing testing. I have scheduled outpatient BLE dopplers given the patient's pain though I suspect this is related to fibromyalgia.  Treated with one dose of lovenox.  Stable for discharge at this time.  Kristi Simpson was evaluated in Emergency Department on 12/03/2019 for the symptoms described in the history of present illness. She was evaluated in the context of the global COVID-19 pandemic, which necessitated consideration that the patient might be at risk for infection with the SARS-CoV-2 virus that causes COVID-19. Institutional protocols and algorithms that pertain to the evaluation of patients at risk for COVID-19 are in a state of rapid change based on information released by regulatory bodies including the CDC and federal and state organizations. These policies and algorithms were followed during the patient's care in the ED.  Final Clinical Impression(s) / ED Diagnoses Final diagnoses:  Pleural effusion  Pain in both lower extremities  Return for intractable cough, coughing up blood,fevers >100.4 unrelieved by medication, shortness of breath, intractable vomiting, chest pain, shortness of breath, weakness,numbness, changes in speech, facial asymmetry,abdominal pain, passing out,Inability to tolerate liquids or food, cough, altered mental status or any concerns. No signs of systemic illness or infection. The patient is nontoxic-appearing on exam and vital signs are within normal limits.   I have reviewed the  triage vital signs and the nursing notes. Pertinent labs &imaging results that were available during my care of the patient were reviewed by me and considered in my medical decision making (see chart for details).  After history, exam, and medical workup I feel the patient has been appropriately medically screened and is safe for discharge home. Pertinent diagnoses were discussed with the patient. Patient was given return   Rx / DC Orders ED Discharge Orders         Ordered    US Venous Img Lower Unilateral Left (DVT)  Status:  Canceled     12/03/19 0132    US Venous Img Lower Bilateral (DVT)     12/03/19 0132           Maame Dack, MD 12/03/19 3810

## 2022-02-18 ENCOUNTER — Ambulatory Visit: Payer: Medicare Other | Admitting: Family Medicine

## 2022-03-02 ENCOUNTER — Ambulatory Visit: Payer: Self-pay

## 2022-03-02 ENCOUNTER — Ambulatory Visit (INDEPENDENT_AMBULATORY_CARE_PROVIDER_SITE_OTHER): Payer: 59 | Admitting: Family Medicine

## 2022-03-02 VITALS — BP 122/84 | Ht 66.0 in | Wt 238.0 lb

## 2022-03-02 DIAGNOSIS — D508 Other iron deficiency anemias: Secondary | ICD-10-CM

## 2022-03-02 DIAGNOSIS — M1712 Unilateral primary osteoarthritis, left knee: Secondary | ICD-10-CM | POA: Diagnosis not present

## 2022-03-02 DIAGNOSIS — M659 Synovitis and tenosynovitis, unspecified: Secondary | ICD-10-CM | POA: Diagnosis not present

## 2022-03-02 DIAGNOSIS — D649 Anemia, unspecified: Secondary | ICD-10-CM | POA: Insufficient documentation

## 2022-03-02 DIAGNOSIS — M25562 Pain in left knee: Secondary | ICD-10-CM

## 2022-03-02 MED ORDER — INDOMETHACIN 50 MG PO CAPS: 50.0000 mg | ORAL_CAPSULE | Freq: Two times a day (BID) | ORAL | 1 refills | Status: AC

## 2022-03-02 NOTE — Assessment & Plan Note (Signed)
Acute on chronic in nature. ?-Counseled on supportive care. ?-Iron and ferritin. ?

## 2022-03-02 NOTE — Progress Notes (Signed)
°  JADALEE WESTCOTT - 41 y.o. female MRN [id number]  Date of birth: [birth year]  SUBJECTIVE:  Including CC & ROS.  No chief complaint on file.   Kimbly A Ziegler is a 41 y.o. female that is presenting with acute on chronic left knee and acute left great toe pain.  She also has a history of iron deficiency anemia.  Her knee pain has been ongoing for over a year.  Her toe pain has been ongoing for about a month.  Reports that she has had some work-up of an anti-inflammatory origin.  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 Review of the bilateral knee x-ray from 8/24 shows mild to moderate left knee OA with trace effusion.   Review of Systems See HPI   HISTORY: Past Medical, Surgical, Social, and Family History Reviewed & Updated per EMR.   Pertinent Historical Findings include:  Past Medical History:  Diagnosis Date   Abdominal pain, unspecified site    CHF (congestive heart failure) (HCC)    Chronic pain    Crohn disease (HCC)    Diabetes mellitus    Fibromyalgia    Gastric ulcer    Hypertension    Migraine    Pancreatitis    Polycystic ovarian disease    Pulmonary embolism (HCC)    Ulcer of ileum     Past Surgical History:  Procedure Laterality Date   BREAST SURGERY     cyst   CHOLECYSTECTOMY     TONSILLECTOMY       PHYSICAL EXAM:  VS: BP 122/84    Ht 5\' 6"  (1.676 m)    Wt 238 lb (108 kg)    BMI 38.41 kg/m  Physical Exam Gen: NAD, alert, cooperative with exam, well-appearing MSK:  Left knee: Tenderness to palpation over the medial joint space. Instability with valgus and varus stress testing. Patient is ambulatory Neurovascularly intact    Limited ultrasound: Left knee, left great toe:  Left knee: Mild effusion suprapatellar pouch. Normal-appearing quadricep and patellar tendon. Moderate medial joint space narrowing with hyperemia. Normal-appearing lateral joint space  Left great  toe: Effusion with hyperechoic crystal appreciated. Increased hyperemia overlying the synovium  Summary: Osteoarthritis of left knee and synovitis of the left great toe.  Ultrasound and interpretation by , MD    ASSESSMENT & PLAN:   Primary osteoarthritis of left knee Acute on chronic in nature.  Has degenerative changes appreciated on previous imaging and on presentation today. -Counseled on home exercise therapy and supportive care. -Medial unloader brace due to thigh to calf ratio. -Could consider further imaging or Zilretta injection.  Synovitis of foot Acutely occurring.  Has inflammatory origins at the great toe.  Previous lab work-up demonstrated a possible inflammatory origin. -Counseled on home exercise therapy and supportive care. -Uric acid, sed rate, CRP, ANA  -Indocin.  Absolute anemia Acute on chronic in nature. -Counseled on supportive care. -Iron and ferritin.

## 2022-03-02 NOTE — Patient Instructions (Signed)
Nice to meet you  ?Please try ice  ?Please try the exercises  ?The [employer] rep will call about the brace  ?I will call with the lab results.  ?Please send me a message in MyChart with any questions or updates.  ?Please see me back in 4 weeks.  ? ?--Dr. Jordan Likes ? ?

## 2022-03-02 NOTE — Assessment & Plan Note (Signed)
Acute on chronic in nature.  Has degenerative changes appreciated on previous imaging and on presentation today. -Counseled on home exercise therapy and supportive care. -Medial unloader brace due to thigh to calf ratio. -Could consider further imaging or Zilretta injection.

## 2022-03-02 NOTE — Assessment & Plan Note (Signed)
Acutely occurring.  Has inflammatory origins at the great toe.  Previous lab work-up demonstrated a possible inflammatory origin. ?-Counseled on home exercise therapy and supportive care. ?-Uric acid, sed rate, CRP, ANA  ?-Indocin. ?

## 2022-03-08 LAB — URIC ACID: Uric Acid: 8.6 mg/dL — ABNORMAL HIGH (ref 2.6–6.2)

## 2022-03-08 LAB — IRON,TIBC AND FERRITIN PANEL
Ferritin: 88 ng/mL (ref 15–150)
Iron Saturation: 7 % — CL (ref 15–55)
Iron: 30 ug/dL (ref 27–159)
Total Iron Binding Capacity: 417 ug/dL (ref 250–450)
UIBC: 387 ug/dL (ref 131–425)

## 2022-03-08 LAB — ANA,IFA RA DIAG PNL W/RFLX TIT/PATN
ANA Titer 1: POSITIVE — AB
Cyclic Citrullin Peptide Ab: 4 units (ref 0–19)
Rheumatoid fact SerPl-aCnc: <10 IU/mL (ref <14.0)

## 2022-03-08 LAB — FANA STAINING PATTERNS: Speckled Pattern: 1:640 {titer} — ABNORMAL HIGH

## 2022-03-08 LAB — SEDIMENTATION RATE: Sed Rate: 34 mm/hr — ABNORMAL HIGH (ref 0–32)

## 2022-03-08 LAB — C-REACTIVE PROTEIN: CRP: 26 mg/L — ABNORMAL HIGH (ref 0–10)

## 2022-03-10 ENCOUNTER — Telehealth: Payer: Self-pay | Admitting: Family Medicine

## 2022-03-10 MED ORDER — PREDNISONE 20 MG PO TABS: ORAL_TABLET | ORAL | 0 refills | Status: AC

## 2022-03-10 NOTE — Telephone Encounter (Signed)
Informed of results. Will send in prednisone.  ? ?Myra Rude, MD ?[facility] ?03/10/2022, 3:01 PM ? ?

## 2022-03-30 ENCOUNTER — Ambulatory Visit: Payer: 59 | Admitting: Family Medicine

## 2023-04-06 ENCOUNTER — Encounter: Payer: Self-pay | Admitting: *Deleted

## 2023-08-17 ENCOUNTER — Encounter (HOSPITAL_BASED_OUTPATIENT_CLINIC_OR_DEPARTMENT_OTHER): Payer: Self-pay | Admitting: Emergency Medicine

## 2023-08-17 ENCOUNTER — Emergency Department (HOSPITAL_BASED_OUTPATIENT_CLINIC_OR_DEPARTMENT_OTHER)
Admission: EM | Admit: 2023-08-17 | Discharge: 2023-08-17 | Disposition: A | Discharge status: Home / Self Care | Payer: 59 | Attending: Emergency Medicine | Admitting: Emergency Medicine

## 2023-08-17 ENCOUNTER — Other Ambulatory Visit: Payer: Self-pay

## 2023-08-17 ENCOUNTER — Emergency Department (HOSPITAL_BASED_OUTPATIENT_CLINIC_OR_DEPARTMENT_OTHER): Payer: 59

## 2023-08-17 DIAGNOSIS — I110 Hypertensive heart disease with heart failure: Secondary | ICD-10-CM | POA: Insufficient documentation

## 2023-08-17 DIAGNOSIS — E039 Hypothyroidism, unspecified: Secondary | ICD-10-CM | POA: Diagnosis not present

## 2023-08-17 DIAGNOSIS — M4316 Spondylolisthesis, lumbar region: Secondary | ICD-10-CM | POA: Diagnosis not present

## 2023-08-17 DIAGNOSIS — Z79899 Other long term (current) drug therapy: Secondary | ICD-10-CM | POA: Insufficient documentation

## 2023-08-17 DIAGNOSIS — M542 Cervicalgia: Secondary | ICD-10-CM | POA: Diagnosis not present

## 2023-08-17 DIAGNOSIS — E119 Type 2 diabetes mellitus without complications: Secondary | ICD-10-CM | POA: Diagnosis not present

## 2023-08-17 DIAGNOSIS — I509 Heart failure, unspecified: Secondary | ICD-10-CM | POA: Diagnosis not present

## 2023-08-17 DIAGNOSIS — Y92410 Unspecified street and highway as the place of occurrence of the external cause: Secondary | ICD-10-CM | POA: Insufficient documentation

## 2023-08-17 DIAGNOSIS — M545 Low back pain, unspecified: Secondary | ICD-10-CM | POA: Diagnosis present

## 2023-08-17 MED ORDER — CYCLOBENZAPRINE HCL 10 MG PO TABS: 10.0000 mg | ORAL_TABLET | Freq: Two times a day (BID) | ORAL | 0 refills | Status: AC | PRN

## 2023-08-17 MED ORDER — IBUPROFEN 600 MG PO TABS: 600.0000 mg | ORAL_TABLET | Freq: Four times a day (QID) | ORAL | 0 refills | Status: AC | PRN
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
## 2023-09-08 ENCOUNTER — Ambulatory Visit (HOSPITAL_BASED_OUTPATIENT_CLINIC_OR_DEPARTMENT_OTHER)
Admission: RE | Admit: 2023-09-08 | Discharge: 2023-09-08 | Disposition: A | Discharge status: Home / Self Care | Payer: 59 | Source: Ambulatory Visit | Attending: Emergency Medicine | Admitting: Emergency Medicine

## 2023-09-08 DIAGNOSIS — I700 Atherosclerosis of aorta: Secondary | ICD-10-CM | POA: Diagnosis not present

## 2023-09-08 DIAGNOSIS — M5127 Other intervertebral disc displacement, lumbosacral region: Secondary | ICD-10-CM | POA: Insufficient documentation

## 2023-09-08 DIAGNOSIS — M47817 Spondylosis without myelopathy or radiculopathy, lumbosacral region: Secondary | ICD-10-CM | POA: Insufficient documentation

## 2023-09-08 DIAGNOSIS — M4317 Spondylolisthesis, lumbosacral region: Secondary | ICD-10-CM | POA: Diagnosis not present

## 2023-09-08 DIAGNOSIS — M545 Low back pain, unspecified: Secondary | ICD-10-CM | POA: Diagnosis present

## 2023-09-09 ENCOUNTER — Ambulatory Visit (HOSPITAL_BASED_OUTPATIENT_CLINIC_OR_DEPARTMENT_OTHER): Payer: 59
